# Patient Record
Sex: Female | Born: 1965 | Race: Black or African American | Hispanic: No | Marital: Married | Smoking: Never smoker
Health system: Southern US, Community
[De-identification: ages and names within clinical notes are randomized; demographics above are authoritative.]

## PROBLEM LIST (undated history)

## (undated) DIAGNOSIS — Z9109 Other allergy status, other than to drugs and biological substances: Secondary | ICD-10-CM

## (undated) DIAGNOSIS — D259 Leiomyoma of uterus, unspecified: Secondary | ICD-10-CM

## (undated) DIAGNOSIS — C50919 Malignant neoplasm of unspecified site of unspecified female breast: Secondary | ICD-10-CM

## (undated) DIAGNOSIS — Z9889 Other specified postprocedural states: Secondary | ICD-10-CM

## (undated) DIAGNOSIS — G5601 Carpal tunnel syndrome, right upper limb: Secondary | ICD-10-CM

## (undated) DIAGNOSIS — M79641 Pain in right hand: Secondary | ICD-10-CM

## (undated) DIAGNOSIS — M79642 Pain in left hand: Secondary | ICD-10-CM

## (undated) DIAGNOSIS — G5602 Carpal tunnel syndrome, left upper limb: Secondary | ICD-10-CM

## (undated) DIAGNOSIS — R112 Nausea with vomiting, unspecified: Secondary | ICD-10-CM

## (undated) DIAGNOSIS — J302 Other seasonal allergic rhinitis: Secondary | ICD-10-CM

## (undated) HISTORY — PX: BREAST REDUCTION SURGERY: SHX8

## (undated) HISTORY — DX: Pain in left hand: M79.642

## (undated) HISTORY — DX: Malignant neoplasm of unspecified site of unspecified female breast: C50.919

## (undated) HISTORY — DX: Pain in right hand: M79.641

## (undated) HISTORY — DX: Carpal tunnel syndrome, left upper limb: G56.02

## (undated) HISTORY — PX: MYOMECTOMY: SHX85

---

## 2005-08-07 ENCOUNTER — Encounter: Admission: RE | Admit: 2005-08-07 | Discharge: 2005-08-07 | Payer: Self-pay | Admitting: Obstetrics and Gynecology

## 2005-08-22 ENCOUNTER — Encounter: Admission: RE | Admit: 2005-08-22 | Discharge: 2005-08-22 | Payer: Self-pay | Admitting: Obstetrics and Gynecology

## 2006-08-28 ENCOUNTER — Encounter: Admission: RE | Admit: 2006-08-28 | Discharge: 2006-08-28 | Payer: Self-pay | Admitting: Obstetrics and Gynecology

## 2007-11-17 ENCOUNTER — Encounter: Admission: RE | Admit: 2007-11-17 | Discharge: 2007-11-17 | Payer: Self-pay | Admitting: Obstetrics and Gynecology

## 2008-12-04 ENCOUNTER — Encounter: Admission: RE | Admit: 2008-12-04 | Discharge: 2008-12-04 | Payer: Self-pay | Admitting: Family Medicine

## 2009-08-04 ENCOUNTER — Emergency Department (HOSPITAL_COMMUNITY): Admission: EM | Admit: 2009-08-04 | Discharge: 2009-08-04 | Payer: Self-pay | Admitting: Family Medicine

## 2010-01-21 ENCOUNTER — Encounter: Admission: RE | Admit: 2010-01-21 | Discharge: 2010-01-21 | Payer: Self-pay | Admitting: Obstetrics and Gynecology

## 2010-11-09 ENCOUNTER — Encounter: Payer: Self-pay | Admitting: Obstetrics and Gynecology

## 2010-12-23 ENCOUNTER — Other Ambulatory Visit: Payer: Self-pay | Admitting: Family Medicine

## 2010-12-23 DIAGNOSIS — Z1231 Encounter for screening mammogram for malignant neoplasm of breast: Secondary | ICD-10-CM

## 2011-01-23 LAB — POCT URINALYSIS DIP (DEVICE)
Bilirubin Urine: NEGATIVE
Glucose, UA: NEGATIVE mg/dL
Ketones, ur: NEGATIVE mg/dL
Nitrite: NEGATIVE
Protein, ur: NEGATIVE mg/dL
Specific Gravity, Urine: 1.015 (ref 1.005–1.030)
Urobilinogen, UA: 2 mg/dL — ABNORMAL HIGH (ref 0.0–1.0)
pH: 8.5 — ABNORMAL HIGH (ref 5.0–8.0)

## 2011-01-23 LAB — POCT PREGNANCY, URINE: Preg Test, Ur: NEGATIVE

## 2011-01-27 ENCOUNTER — Ambulatory Visit: Payer: Self-pay

## 2011-02-03 ENCOUNTER — Ambulatory Visit
Admission: RE | Admit: 2011-02-03 | Discharge: 2011-02-03 | Disposition: A | Discharge status: Home / Self Care | Payer: 59 | Source: Ambulatory Visit | Attending: Family Medicine | Admitting: Family Medicine

## 2011-02-03 ENCOUNTER — Other Ambulatory Visit: Payer: Self-pay | Admitting: Obstetrics and Gynecology

## 2011-02-03 DIAGNOSIS — Z1231 Encounter for screening mammogram for malignant neoplasm of breast: Secondary | ICD-10-CM

## 2011-03-10 ENCOUNTER — Other Ambulatory Visit: Payer: Self-pay | Admitting: Obstetrics and Gynecology

## 2011-03-10 DIAGNOSIS — D219 Benign neoplasm of connective and other soft tissue, unspecified: Secondary | ICD-10-CM

## 2011-03-13 ENCOUNTER — Ambulatory Visit
Admission: RE | Admit: 2011-03-13 | Discharge: 2011-03-13 | Disposition: A | Discharge status: Home / Self Care | Payer: 59 | Source: Ambulatory Visit | Attending: Obstetrics and Gynecology | Admitting: Obstetrics and Gynecology

## 2011-03-13 DIAGNOSIS — D219 Benign neoplasm of connective and other soft tissue, unspecified: Secondary | ICD-10-CM

## 2011-03-13 MED ORDER — GADOBENATE DIMEGLUMINE 529 MG/ML IV SOLN
15.0000 mL | Freq: Once | INTRAVENOUS | Status: AC | PRN
  Administered 2011-03-13: 15 mL via INTRAVENOUS

## 2011-03-27 ENCOUNTER — Other Ambulatory Visit (HOSPITAL_COMMUNITY): Payer: Self-pay | Admitting: Obstetrics and Gynecology

## 2011-03-27 DIAGNOSIS — N979 Female infertility, unspecified: Secondary | ICD-10-CM

## 2011-04-01 ENCOUNTER — Ambulatory Visit (HOSPITAL_COMMUNITY)
Admission: RE | Admit: 2011-04-01 | Discharge: 2011-04-01 | Disposition: A | Discharge status: Home / Self Care | Payer: 59 | Source: Ambulatory Visit | Attending: Obstetrics and Gynecology | Admitting: Obstetrics and Gynecology

## 2011-04-01 DIAGNOSIS — D259 Leiomyoma of uterus, unspecified: Secondary | ICD-10-CM | POA: Insufficient documentation

## 2011-04-01 DIAGNOSIS — N979 Female infertility, unspecified: Secondary | ICD-10-CM | POA: Insufficient documentation

## 2011-12-31 ENCOUNTER — Other Ambulatory Visit: Payer: Self-pay | Admitting: Family Medicine

## 2011-12-31 DIAGNOSIS — Z1231 Encounter for screening mammogram for malignant neoplasm of breast: Secondary | ICD-10-CM

## 2012-01-04 ENCOUNTER — Encounter (HOSPITAL_COMMUNITY): Payer: Self-pay

## 2012-01-04 ENCOUNTER — Emergency Department (INDEPENDENT_AMBULATORY_CARE_PROVIDER_SITE_OTHER)
Admission: EM | Admit: 2012-01-04 | Discharge: 2012-01-04 | Disposition: A | Discharge status: Home / Self Care | Payer: 59 | Source: Home / Self Care | Attending: Emergency Medicine | Admitting: Emergency Medicine

## 2012-01-04 DIAGNOSIS — G5603 Carpal tunnel syndrome, bilateral upper limbs: Secondary | ICD-10-CM

## 2012-01-04 DIAGNOSIS — G5600 Carpal tunnel syndrome, unspecified upper limb: Secondary | ICD-10-CM

## 2012-01-04 HISTORY — DX: Other allergy status, other than to drugs and biological substances: Z91.09

## 2012-01-04 MED ORDER — CARPAL TUNNEL WRIST STABILIZER MISC: 2.0000 [IU] | Freq: Every day | Status: DC

## 2012-01-04 MED ORDER — MELOXICAM 15 MG PO TABS: 15.0000 mg | ORAL_TABLET | Freq: Every day | ORAL | Status: DC

## 2012-01-04 NOTE — ED Notes (Signed)
Pt c/o of numbness to both hands and pain which increase at night.

## 2012-01-04 NOTE — Discharge Instructions (Signed)
Wear the splints at all times. Take your hands out periodically to move your wrists through full range of motion to prevent stiffness. You will need to follow up with Dr. Amanda Pea if you are not getting better in 7-10 days.

## 2012-01-08 NOTE — ED Provider Notes (Signed)
History     CSN: [id number]  Arrival date & time 01/04/12  1320   First MD Initiated Contact with Patient 01/04/12 1336      Chief Complaint  Patient presents with  . Numbness    numbness to both hand    (Consider location/radiation/quality/duration/timing/severity/associated sxs/prior treatment) HPI Comments: Patient is a right-handed female who reports bilateral hand numbness, tingling over the past few weeks. Patient states the pain is worse at night, and wakes her up. States she wakes up with her  and her wrists flexed. 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 No hand swelling, redness, bruising, recent or remote history of injury to her hands, wrists, forearms. No weakness of her grips bilaterally. Patient has not tried anything for this.  Patient is a 46 y.o. female presenting with hand pain. The history is provided by the patient. No language interpreter was used.  Hand Pain This is a chronic problem. The current episode started more than 1 week ago. The problem has been gradually worsening. The symptoms are relieved by nothing. She has tried nothing for the symptoms. The treatment provided no relief.    Past Medical History  Diagnosis Date  . Environmental allergies   . Fibroid     Past Surgical History  Procedure Date  . Myomectomy     History reviewed. No pertinent family history.  History  Substance Use Topics  . Smoking status: Never Smoker   . Smokeless tobacco: Not on file  . Alcohol Use: No    OB History    Grav Para Term Preterm Abortions TAB SAB Ect Mult Living                  Review of Systems  Musculoskeletal: Negative for myalgias and arthralgias.  Skin: Negative for color change, rash and wound.  Neurological: Positive for numbness. Negative for weakness.    Allergies  Review of patient's allergies indicates no known allergies.  Home Medications   Current Outpatient Rx  Name Route Sig  Dispense Refill  . CETIRIZINE HCL 5 MG PO TABS Oral Take 5 mg by mouth daily.    Marland Kitchen CARPAL TUNNEL WRIST STABILIZER MISC Does not apply 2 Units by Does not apply route daily. 2 each 0  . MELOXICAM 15 MG PO TABS Oral Take 1 tablet (15 mg total) by mouth daily. 14 tablet 0    BP 131/85  Pulse 76  Temp(Src) 98.9 F (37.2 C) (Oral)  Resp 16  SpO2 100%  LMP 01/04/2012  Physical Exam  Nursing note and vitals reviewed. Constitutional: She is oriented to person, place, and time. She appears well-developed and well-nourished. No distress.  HENT:  Head: Normocephalic and atraumatic.  Eyes: Conjunctivae and EOM are normal.  Neck: Normal range of motion.  Cardiovascular: Normal rate.   Pulmonary/Chest: Effort normal.  Abdominal: She exhibits no distension.  Musculoskeletal: Normal range of motion.       Tunnel, Phalen's test positive bilaterally. motor and sensation in median/radial/ulnar nerve distribution with CR< 2 secs and pulse intact b/l.   2-point discrimination intact at 5mm in all fingers and grip 5/5 bilaterally. Skin intact. No signs of trauma, swelling. Wrist, forearm, elbow WNL.   Neurological: She is alert and oriented to person, place, and time.  Skin: Skin is warm and dry.  Psychiatric: She has a normal mood and affect. Her behavior is normal. Judgment and thought content normal.    ED  Course  Procedures (including critical care time)  Labs Reviewed - No data to display No results found.   1. Bilateral carpal tunnel syndrome       MDM  H&P most consistent with bilateral carpal tunnel syndrome. Placing patient and bilateral splints, NSAIDs. Will have her followup with hand surgeon on call, if she is not better with conservative treatment in 10 days  Luiz Blare, MD 01/08/12 1340

## 2012-01-19 DIAGNOSIS — D259 Leiomyoma of uterus, unspecified: Secondary | ICD-10-CM | POA: Insufficient documentation

## 2012-02-09 ENCOUNTER — Ambulatory Visit: Payer: 59

## 2012-02-20 ENCOUNTER — Ambulatory Visit
Admission: RE | Admit: 2012-02-20 | Discharge: 2012-02-20 | Disposition: A | Discharge status: Home / Self Care | Payer: 59 | Source: Ambulatory Visit | Attending: Family Medicine | Admitting: Family Medicine

## 2012-02-20 DIAGNOSIS — Z1231 Encounter for screening mammogram for malignant neoplasm of breast: Secondary | ICD-10-CM

## 2012-03-16 ENCOUNTER — Other Ambulatory Visit: Payer: Self-pay | Admitting: Orthopedic Surgery

## 2012-03-18 ENCOUNTER — Encounter (HOSPITAL_BASED_OUTPATIENT_CLINIC_OR_DEPARTMENT_OTHER): Payer: Self-pay | Admitting: *Deleted

## 2012-03-18 NOTE — Progress Notes (Signed)
[facility] No labs needed

## 2012-03-19 ENCOUNTER — Ambulatory Visit (HOSPITAL_BASED_OUTPATIENT_CLINIC_OR_DEPARTMENT_OTHER): Payer: 59 | Admitting: *Deleted

## 2012-03-19 ENCOUNTER — Encounter (HOSPITAL_BASED_OUTPATIENT_CLINIC_OR_DEPARTMENT_OTHER): Payer: Self-pay | Admitting: Orthopedic Surgery

## 2012-03-19 ENCOUNTER — Encounter (HOSPITAL_BASED_OUTPATIENT_CLINIC_OR_DEPARTMENT_OTHER): Payer: Self-pay | Admitting: *Deleted

## 2012-03-19 ENCOUNTER — Encounter (HOSPITAL_BASED_OUTPATIENT_CLINIC_OR_DEPARTMENT_OTHER)
Admission: RE | Disposition: A | Discharge status: Home / Self Care | Payer: Self-pay | Source: Ambulatory Visit | Attending: Orthopedic Surgery

## 2012-03-19 ENCOUNTER — Ambulatory Visit (HOSPITAL_BASED_OUTPATIENT_CLINIC_OR_DEPARTMENT_OTHER)
Admission: RE | Admit: 2012-03-19 | Discharge: 2012-03-19 | Disposition: A | Discharge status: Home / Self Care | Payer: 59 | Source: Ambulatory Visit | Attending: Orthopedic Surgery | Admitting: Orthopedic Surgery

## 2012-03-19 DIAGNOSIS — G5600 Carpal tunnel syndrome, unspecified upper limb: Secondary | ICD-10-CM | POA: Insufficient documentation

## 2012-03-19 HISTORY — PX: CARPAL TUNNEL RELEASE: SHX101

## 2012-03-19 LAB — POCT HEMOGLOBIN-HEMACUE: Hemoglobin: 11.8 g/dL — ABNORMAL LOW (ref 12.0–15.0)

## 2012-03-19 SURGERY — CARPAL TUNNEL RELEASE
Anesthesia: General | Site: Hand | Laterality: Left | Wound class: Clean

## 2012-03-19 MED ORDER — ONDANSETRON HCL 4 MG/2ML IJ SOLN: 4.0000 mg | Freq: Once | INTRAMUSCULAR | Status: DC | PRN

## 2012-03-19 MED ORDER — CHLORHEXIDINE GLUCONATE 4 % EX LIQD: 60.0000 mL | Freq: Once | CUTANEOUS | Status: DC

## 2012-03-19 MED ORDER — ONDANSETRON HCL 4 MG/2ML IJ SOLN
INTRAMUSCULAR | Status: DC | PRN
  Administered 2012-03-19: 4 mg via INTRAVENOUS

## 2012-03-19 MED ORDER — HYDROMORPHONE HCL PF 1 MG/ML IJ SOLN: 0.2500 mg | INTRAMUSCULAR | Status: DC | PRN

## 2012-03-19 MED ORDER — MIDAZOLAM HCL 5 MG/5ML IJ SOLN
INTRAMUSCULAR | Status: DC | PRN
  Administered 2012-03-19: 2 mg via INTRAVENOUS

## 2012-03-19 MED ORDER — LACTATED RINGERS IV SOLN
INTRAVENOUS | Status: DC
  Administered 2012-03-19 (×2): via INTRAVENOUS

## 2012-03-19 MED ORDER — PROPOFOL 10 MG/ML IV EMUL
INTRAVENOUS | Status: DC | PRN
  Administered 2012-03-19: 200 mg via INTRAVENOUS

## 2012-03-19 MED ORDER — HYDROCODONE-ACETAMINOPHEN 5-500 MG PO TABS: 1.0000 | ORAL_TABLET | ORAL | Status: AC | PRN

## 2012-03-19 MED ORDER — LIDOCAINE HCL (CARDIAC) 20 MG/ML IV SOLN
INTRAVENOUS | Status: DC | PRN
  Administered 2012-03-19: 60 mg via INTRAVENOUS

## 2012-03-19 MED ORDER — BUPIVACAINE HCL (PF) 0.25 % IJ SOLN
INTRAMUSCULAR | Status: DC | PRN
  Administered 2012-03-19: 9 mL

## 2012-03-19 MED ORDER — DEXAMETHASONE SODIUM PHOSPHATE 10 MG/ML IJ SOLN
INTRAMUSCULAR | Status: DC | PRN
  Administered 2012-03-19: 10 mg via INTRAVENOUS

## 2012-03-19 MED ORDER — FENTANYL CITRATE 0.05 MG/ML IJ SOLN
INTRAMUSCULAR | Status: DC | PRN
  Administered 2012-03-19: 50 ug via INTRAVENOUS
  Administered 2012-03-19: 25 ug via INTRAVENOUS
  Administered 2012-03-19: 50 ug via INTRAVENOUS

## 2012-03-19 SURGICAL SUPPLY — 39 items
BANDAGE GAUZE ELAST BULKY 4 IN (GAUZE/BANDAGES/DRESSINGS) ×2 IMPLANT
BLADE SURG 15 STRL LF DISP TIS (BLADE) ×1 IMPLANT
BLADE SURG 15 STRL SS (BLADE) ×1
BNDG COHESIVE 3X5 TAN STRL LF (GAUZE/BANDAGES/DRESSINGS) ×2 IMPLANT
BNDG ESMARK 4X9 LF (GAUZE/BANDAGES/DRESSINGS) ×2 IMPLANT
CHLORAPREP W/TINT 26ML (MISCELLANEOUS) ×2 IMPLANT
CLOTH BEACON ORANGE TIMEOUT ST (SAFETY) ×2 IMPLANT
CORDS BIPOLAR (ELECTRODE) ×2 IMPLANT
COVER MAYO STAND STRL (DRAPES) ×2 IMPLANT
COVER TABLE BACK 60X90 (DRAPES) ×2 IMPLANT
CUFF TOURNIQUET SINGLE 18IN (TOURNIQUET CUFF) ×2 IMPLANT
DRAPE EXTREMITY T 121X128X90 (DRAPE) ×2 IMPLANT
DRAPE SURG 17X23 STRL (DRAPES) ×2 IMPLANT
DRSG KUZMA FLUFF (GAUZE/BANDAGES/DRESSINGS) ×2 IMPLANT
GAUZE XEROFORM 1X8 LF (GAUZE/BANDAGES/DRESSINGS) ×2 IMPLANT
GLOVE BIO SURGEON STRL SZ 6.5 (GLOVE) ×2 IMPLANT
GLOVE BIO SURGEON STRL SZ7.5 (GLOVE) ×2 IMPLANT
GLOVE BIOGEL PI IND STRL 7.0 (GLOVE) ×1 IMPLANT
GLOVE BIOGEL PI IND STRL 8 (GLOVE) ×1 IMPLANT
GLOVE BIOGEL PI INDICATOR 7.0 (GLOVE) ×1
GLOVE BIOGEL PI INDICATOR 8 (GLOVE) ×1
GLOVE SURG ORTHO 8.0 STRL STRW (GLOVE) ×2 IMPLANT
GOWN BRE IMP PREV XXLGXLNG (GOWN DISPOSABLE) IMPLANT
GOWN PREVENTION PLUS XLARGE (GOWN DISPOSABLE) IMPLANT
NEEDLE 27GAX1X1/2 (NEEDLE) ×2 IMPLANT
NS IRRIG 1000ML POUR BTL (IV SOLUTION) ×2 IMPLANT
PACK BASIN DAY SURGERY FS (CUSTOM PROCEDURE TRAY) ×2 IMPLANT
PAD CAST 3X4 CTTN HI CHSV (CAST SUPPLIES) ×1 IMPLANT
PADDING CAST ABS 4INX4YD NS (CAST SUPPLIES) ×1
PADDING CAST ABS COTTON 4X4 ST (CAST SUPPLIES) ×1 IMPLANT
PADDING CAST COTTON 3X4 STRL (CAST SUPPLIES) ×1
SPONGE GAUZE 4X4 12PLY (GAUZE/BANDAGES/DRESSINGS) ×2 IMPLANT
STOCKINETTE 4X48 STRL (DRAPES) ×2 IMPLANT
SUT VICRYL 4-0 PS2 18IN ABS (SUTURE) IMPLANT
SUT VICRYL RAPIDE 4/0 PS 2 (SUTURE) ×2 IMPLANT
SYR BULB 3OZ (MISCELLANEOUS) ×2 IMPLANT
SYR CONTROL 10ML LL (SYRINGE) ×2 IMPLANT
TOWEL OR 17X24 6PK STRL BLUE (TOWEL DISPOSABLE) IMPLANT
UNDERPAD 30X30 INCONTINENT (UNDERPADS AND DIAPERS) ×2 IMPLANT

## 2012-03-19 NOTE — Anesthesia Preprocedure Evaluation (Addendum)
Anesthesia Evaluation  Patient identified by MRN, date of birth, ID band Patient awake    Reviewed: Allergy & Precautions, H&P , NPO status , Patient's Chart, lab work & pertinent test results  Airway Mallampati: I      Dental  (+) Teeth Intact   Pulmonary  breath sounds clear to auscultation        Cardiovascular Rhythm:Regular Rate:Normal     Neuro/Psych    GI/Hepatic   Endo/Other    Renal/GU      Musculoskeletal   Abdominal   Peds  Hematology   Anesthesia Other Findings   Reproductive/Obstetrics                           Anesthesia Physical Anesthesia Plan  ASA: I  Anesthesia Plan: General   Post-op Pain Management:    Induction: Intravenous  Airway Management Planned: LMA  Additional Equipment:   Intra-op Plan:   Post-operative Plan:   Informed Consent: I have reviewed the patients History and Physical, chart, labs and discussed the procedure including the risks, benefits and alternatives for the proposed anesthesia with the patient or authorized representative who has indicated his/her understanding and acceptance.   Dental advisory given  Plan Discussed with:   Anesthesia Plan Comments: (L CTS   Plan GA with LMA  Kipp Brood, MD)        Anesthesia Quick Evaluation

## 2012-03-19 NOTE — Brief Op Note (Signed)
03/19/2012  1:16 PM  PATIENT:  Jasmine Batten Meadows  46 y.o. female  PRE-OPERATIVE DIAGNOSIS:  left carpal tunnel syndrome  POST-OPERATIVE DIAGNOSIS:  left carpal tunnel syndrome  PROCEDURE:  Procedure(s) (LRB): CARPAL TUNNEL RELEASE (Left)  SURGEON:  Surgeon(s) and Role:    * Nicki Reaper, MD - Primary  PHYSICIAN ASSISTANT:   ASSISTANTS: none   ANESTHESIA:   local and general  EBL:  Total I/O In: 1000 [I.V.:1000] Out: -   BLOOD ADMINISTERED:none  DRAINS: none   LOCAL MEDICATIONS USED:  MARCAINE     SPECIMEN:  No Specimen  DISPOSITION OF SPECIMEN:  N/A  COUNTS:  YES  TOURNIQUET:   Total Tourniquet Time Documented: Forearm (Left) - 15 minutes  DICTATION: .Other Dictation: Dictation Number [id number]  PLAN OF CARE: Discharge to home after PACU  PATIENT DISPOSITION:  PACU - hemodynamically stable.

## 2012-03-19 NOTE — Op Note (Signed)
NAMEMarland Kitchen  DECHELLE, ATTAWAY NO.:  [id number]  MEDICAL RECORD NO.:  [id number]  LOCATION:                                 FACILITY:  PHYSICIAN:  Cindee Salt, M.D.       DATE OF BIRTH:  12-16-1965  DATE OF PROCEDURE:  03/19/2012 DATE OF DISCHARGE:                              OPERATIVE REPORT   PREOPERATIVE DIAGNOSIS:  Carpal tunnel syndrome, left hand.  POSTOPERATIVE DIAGNOSIS:  Carpal tunnel syndrome, left hand.  OPERATION:  Release median nerve, left wrist.  SURGEON:  Cindee Salt, M.D.  ANESTHESIA:  General with local infiltration.  ANESTHESIOLOGIST:  Dr. Jean Rosenthal.  HISTORY:  The patient is a 46 year old female with a history of carpal tunnel syndrome, EMG nerve conductions positive.  This has not responded to conservative treatment.  She has elected to undergo surgical release. Pre, peri, and postoperative course have been discussed along with risks and complications.  She is aware that there is no guarantee with the surgery, possibility of infection, recurrence, injury to arteries, nerves, tendons, incomplete relief of symptoms, dystrophy.  In preoperative area, the patient is seen, the extremity marked by both the patient and surgeon.  Antibiotic given.  PROCEDURE:  The patient was brought to the operating room where a general anesthetic was carried out without difficulty.  She was prepped using ChloraPrep, supine position with the left arm free.  A 3 minute dry time was allowed.  Time-out taken, confirming the patient, procedure.  The limb was exsanguinated with an Esmarch bandage. Tourniquet placed high on the arm, was inflated to 250 mmHg.  A longitudinal incision was made in the palm, carried down through subcutaneous tissue.  Bleeders were electrocauterized with bipolar.  The right angle retractor, Sewall retractor were placed and the median nerve was released on its ulnar aspect taking care to protect both median and ulnar nerves.  The nerve was  explored.  Air compression to the nerve was apparent.  No further lesions were identified.  The wound was irrigated. The skin was closed with interrupted 4-0 Vicryl Rapide sutures. Local infiltration 0.25% Marcaine without epinephrine was given, approximately 9 mL was used.  A sterile compressive dressing was applied with the fingers free.  On deflation of the tourniquet, all fingers immediately pinked.  She was taken to the recovery room for observation in satisfactory condition.          ______________________________ Cindee Salt, M.D.     GK/MEDQ  D:  03/19/2012  T:  03/19/2012  Job:  [id number]

## 2012-03-19 NOTE — Transfer of Care (Signed)
Immediate Anesthesia Transfer of Care Note  Patient: Jasmine Meadows  Procedure(s) Performed: Procedure(s) (LRB): CARPAL TUNNEL RELEASE (Left)  Patient Location: PACU  Anesthesia Type: General  Level of Consciousness: awake, alert , oriented and patient cooperative  Airway & Oxygen Therapy: Patient Spontanous Breathing and Patient connected to face mask oxygen  Post-op Assessment: Report given to PACU RN and Post -op Vital signs reviewed and stable  Post vital signs: Reviewed and stable  Complications: No apparent anesthesia complications

## 2012-03-19 NOTE — Anesthesia Procedure Notes (Signed)
Procedure Name: LMA Insertion Date/Time: 03/19/2012 12:50 PM Performed by: Aurora Rody D Pre-anesthesia Checklist: Patient identified, Emergency Drugs available, Suction available and Patient being monitored Patient Re-evaluated:Patient Re-evaluated prior to inductionOxygen Delivery Method: Circle System Utilized Preoxygenation: Pre-oxygenation with 100% oxygen Intubation Type: IV induction Ventilation: Mask ventilation without difficulty LMA: LMA inserted LMA Size: 4.0 Number of attempts: 1 Airway Equipment and Method: bite block Placement Confirmation: positive ETCO2 Tube secured with: Tape Dental Injury: Teeth and Oropharynx as per pre-operative assessment

## 2012-03-19 NOTE — Op Note (Signed)
Dictated number: [id number]

## 2012-03-19 NOTE — H&P (Signed)
  Jasmine Meadows is a 46 year old right hand dominant female referred by Dr. Sigmund Hazel for a consultation with respect to bilateral hand numbness, tingling and pain. This has been going on for at least 4 months. She has some discomfort going up to her left shoulder on her left side only. She complains of being awakened 7 out of 7 nights. She has no history of injury to the hand or neck. She complains of constant, moderate to severe sharp pain with a feeling of swelling, numbness to the thumb especially on a constant basis to the middle and ring fingers. She states it is getting worse. Activity and work makes it worse, elevation has helped. She has taken a Medrol Dosepak which gave her some relief of symptoms. She has had nerve conductions done by Dr. Murray Hodgkins revealing severe carpal tunnel syndrome bilaterally with motor delay greater than 7 on each side and no sensory response. She states that her mother had carpal tunnel syndrome. No history of diabetes, thyroid problems, arthritis or gout. There is a family history of arthritis. She has been wearing a brace at night.   Past Medical History: She has no allergies. She is on Zyrtec. She relates no surgery.  Family Medical History: Negative.  Social History: She does not smoke or drink. She is married and works at [employer] in the [employer]  Review of Systems: Negative for 14 points.  Jasmine Meadows is an 46 y.o. female.   Chief Complaint: CTS lt HPI:  See above   Past Medical History  Diagnosis Date  . Environmental allergies   . Fibroid   . Carpal tunnel syndrome   . No pertinent past medical history     Past Surgical History  Procedure Date  . Myomectomy     No family history on file. Social History:  reports that she has never smoked. She does not have any smokeless tobacco history on file. She reports that she does not drink alcohol or use illicit drugs.  Allergies: No Known Allergies  No prescriptions prior to admission    No  results found for this or any previous visit (from the past 48 hour(s)).  No results found.   Pertinent items are noted in HPI.  Height 5\' 2"  (1.575 m), weight 83.915 kg (185 lb), last menstrual period 02/02/2012.  General appearance: alert, cooperative and appears stated age Head: Normocephalic, without obvious abnormality Neck: no adenopathy Resp: clear to auscultation bilaterally Cardio: regular rate and rhythm, S1, S2 normal, no murmur, click, rub or gallop GI: soft, non-tender; bowel sounds normal; no masses,  no organomegaly Extremities: extremities normal, atraumatic, no cyanosis or edema Pulses: 2+ and symmetric Skin: Skin color, texture, turgor normal. 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  We have discussed with her the possibility of surgical intervention. The pre, peri and post op course are discussed along with risks and complications. She is aware there is no guarantee with surgery, possibility of infection, recurrence, injury to arteries, nerves and tendons, incomplete relief of symptoms and dystrophy.  She would like to proceed to have the left side done. She is scheduled for left carpal tunnel release as an outpatient   Jasmine Meadows 03/19/2012, 7:20 AM

## 2012-03-19 NOTE — Discharge Instructions (Addendum)

## 2012-03-22 NOTE — Anesthesia Postprocedure Evaluation (Signed)
  Anesthesia Post-op Note  Patient: Jasmine Meadows  Procedure(s) Performed: Procedure(s) (LRB): CARPAL TUNNEL RELEASE (Left)  Patient Location: PACU  Anesthesia Type: General  Level of Consciousness: awake and alert   Airway and Oxygen Therapy: Patient Spontanous Breathing  Post-op Pain: none  Post-op Assessment: Post-op Vital signs reviewed and Patient's Cardiovascular Status Stable  Post-op Vital Signs: stable  Complications: No apparent anesthesia complications

## 2012-03-24 ENCOUNTER — Encounter (HOSPITAL_BASED_OUTPATIENT_CLINIC_OR_DEPARTMENT_OTHER): Payer: Self-pay | Admitting: Orthopedic Surgery

## 2012-04-19 DIAGNOSIS — G5601 Carpal tunnel syndrome, right upper limb: Secondary | ICD-10-CM

## 2012-04-19 HISTORY — DX: Carpal tunnel syndrome, right upper limb: G56.01

## 2012-04-27 ENCOUNTER — Other Ambulatory Visit: Payer: Self-pay | Admitting: Orthopedic Surgery

## 2012-04-30 ENCOUNTER — Encounter (HOSPITAL_BASED_OUTPATIENT_CLINIC_OR_DEPARTMENT_OTHER): Payer: Self-pay | Admitting: *Deleted

## 2012-05-07 ENCOUNTER — Encounter (HOSPITAL_BASED_OUTPATIENT_CLINIC_OR_DEPARTMENT_OTHER): Payer: Self-pay | Admitting: *Deleted

## 2012-05-07 ENCOUNTER — Encounter (HOSPITAL_BASED_OUTPATIENT_CLINIC_OR_DEPARTMENT_OTHER)
Admission: RE | Disposition: A | Discharge status: Home / Self Care | Payer: Self-pay | Source: Ambulatory Visit | Attending: Orthopedic Surgery

## 2012-05-07 ENCOUNTER — Ambulatory Visit (HOSPITAL_BASED_OUTPATIENT_CLINIC_OR_DEPARTMENT_OTHER): Payer: 59 | Admitting: *Deleted

## 2012-05-07 ENCOUNTER — Encounter (HOSPITAL_BASED_OUTPATIENT_CLINIC_OR_DEPARTMENT_OTHER): Payer: Self-pay | Admitting: Anesthesiology

## 2012-05-07 ENCOUNTER — Ambulatory Visit (HOSPITAL_BASED_OUTPATIENT_CLINIC_OR_DEPARTMENT_OTHER)
Admission: RE | Admit: 2012-05-07 | Discharge: 2012-05-07 | Disposition: A | Discharge status: Home / Self Care | Payer: 59 | Source: Ambulatory Visit | Attending: Orthopedic Surgery | Admitting: Orthopedic Surgery

## 2012-05-07 DIAGNOSIS — G5600 Carpal tunnel syndrome, unspecified upper limb: Secondary | ICD-10-CM | POA: Insufficient documentation

## 2012-05-07 HISTORY — DX: Leiomyoma of uterus, unspecified: D25.9

## 2012-05-07 HISTORY — DX: Other specified postprocedural states: Z98.890

## 2012-05-07 HISTORY — PX: CARPAL TUNNEL RELEASE: SHX101

## 2012-05-07 HISTORY — DX: Carpal tunnel syndrome, right upper limb: G56.01

## 2012-05-07 HISTORY — DX: Nausea with vomiting, unspecified: R11.2

## 2012-05-07 HISTORY — DX: Other seasonal allergic rhinitis: J30.2

## 2012-05-07 LAB — POCT HEMOGLOBIN-HEMACUE: Hemoglobin: 13 g/dL (ref 12.0–15.0)

## 2012-05-07 SURGERY — CARPAL TUNNEL RELEASE
Anesthesia: Regional | Laterality: Right | Wound class: Clean

## 2012-05-07 MED ORDER — OXYCODONE HCL 5 MG/5ML PO SOLN: 5.0000 mg | Freq: Once | ORAL | Status: DC | PRN

## 2012-05-07 MED ORDER — FENTANYL CITRATE 0.05 MG/ML IJ SOLN
INTRAMUSCULAR | Status: DC | PRN
  Administered 2012-05-07: 25 ug via INTRAVENOUS
  Administered 2012-05-07: 50 ug via INTRAVENOUS

## 2012-05-07 MED ORDER — MIDAZOLAM HCL 5 MG/5ML IJ SOLN
INTRAMUSCULAR | Status: DC | PRN
  Administered 2012-05-07: 2 mg via INTRAVENOUS

## 2012-05-07 MED ORDER — CEFAZOLIN SODIUM 1-5 GM-% IV SOLN: 1.0000 g | Freq: Once | INTRAVENOUS | Status: DC

## 2012-05-07 MED ORDER — LIDOCAINE HCL (CARDIAC) 20 MG/ML IV SOLN
INTRAVENOUS | Status: DC | PRN
  Administered 2012-05-07: 50 mg via INTRAVENOUS

## 2012-05-07 MED ORDER — DEXAMETHASONE SODIUM PHOSPHATE 10 MG/ML IJ SOLN
INTRAMUSCULAR | Status: DC | PRN
  Administered 2012-05-07: 10 mg via INTRAVENOUS

## 2012-05-07 MED ORDER — PROPOFOL 10 MG/ML IV EMUL
INTRAVENOUS | Status: DC | PRN
  Administered 2012-05-07: 100 ug/kg/min via INTRAVENOUS

## 2012-05-07 MED ORDER — HYDROCODONE-ACETAMINOPHEN 5-500 MG PO TABS: 1.0000 | ORAL_TABLET | ORAL | Status: AC | PRN

## 2012-05-07 MED ORDER — CHLORHEXIDINE GLUCONATE 4 % EX LIQD: 60.0000 mL | Freq: Once | CUTANEOUS | Status: DC

## 2012-05-07 MED ORDER — HYDROMORPHONE HCL PF 1 MG/ML IJ SOLN: 0.2500 mg | INTRAMUSCULAR | Status: DC | PRN

## 2012-05-07 MED ORDER — LACTATED RINGERS IV SOLN
INTRAVENOUS | Status: DC
  Administered 2012-05-07: 11:00:00 via INTRAVENOUS

## 2012-05-07 MED ORDER — LIDOCAINE HCL (PF) 0.5 % IJ SOLN
INTRAMUSCULAR | Status: DC | PRN
  Administered 2012-05-07: 35 mL via INTRATHECAL

## 2012-05-07 MED ORDER — OXYCODONE HCL 5 MG PO TABS: 5.0000 mg | ORAL_TABLET | Freq: Once | ORAL | Status: DC | PRN

## 2012-05-07 MED ORDER — METOCLOPRAMIDE HCL 5 MG/ML IJ SOLN: 10.0000 mg | Freq: Once | INTRAMUSCULAR | Status: DC | PRN

## 2012-05-07 MED ORDER — ONDANSETRON HCL 4 MG/2ML IJ SOLN
INTRAMUSCULAR | Status: DC | PRN
  Administered 2012-05-07: 4 mg via INTRAVENOUS

## 2012-05-07 MED ORDER — CEFAZOLIN SODIUM-DEXTROSE 2-3 GM-% IV SOLR
2.0000 g | Freq: Once | INTRAVENOUS | Status: AC
  Administered 2012-05-07: 2 g via INTRAVENOUS

## 2012-05-07 MED ORDER — BUPIVACAINE HCL (PF) 0.25 % IJ SOLN
INTRAMUSCULAR | Status: DC | PRN
  Administered 2012-05-07: 5.5 mL

## 2012-05-07 SURGICAL SUPPLY — 36 items
BANDAGE GAUZE ELAST BULKY 4 IN (GAUZE/BANDAGES/DRESSINGS) ×2 IMPLANT
BLADE SURG 15 STRL LF DISP TIS (BLADE) ×1 IMPLANT
BLADE SURG 15 STRL SS (BLADE) ×1
BNDG COHESIVE 3X5 TAN STRL LF (GAUZE/BANDAGES/DRESSINGS) ×2 IMPLANT
BNDG ESMARK 4X9 LF (GAUZE/BANDAGES/DRESSINGS) IMPLANT
CHLORAPREP W/TINT 26ML (MISCELLANEOUS) ×2 IMPLANT
CLOTH BEACON ORANGE TIMEOUT ST (SAFETY) ×2 IMPLANT
CORDS BIPOLAR (ELECTRODE) ×2 IMPLANT
COVER MAYO STAND STRL (DRAPES) ×2 IMPLANT
COVER TABLE BACK 60X90 (DRAPES) ×2 IMPLANT
CUFF TOURNIQUET SINGLE 18IN (TOURNIQUET CUFF) ×2 IMPLANT
DRAPE EXTREMITY T 121X128X90 (DRAPE) ×2 IMPLANT
DRAPE SURG 17X23 STRL (DRAPES) ×4 IMPLANT
DRSG KUZMA FLUFF (GAUZE/BANDAGES/DRESSINGS) ×2 IMPLANT
GAUZE XEROFORM 1X8 LF (GAUZE/BANDAGES/DRESSINGS) ×2 IMPLANT
GLOVE BIO SURGEON STRL SZ 6.5 (GLOVE) ×2 IMPLANT
GLOVE SKINSENSE NS SZ7.0 (GLOVE) ×1
GLOVE SKINSENSE STRL SZ7.0 (GLOVE) ×1 IMPLANT
GLOVE SURG ORTHO 8.0 STRL STRW (GLOVE) ×4 IMPLANT
GOWN BRE IMP PREV XXLGXLNG (GOWN DISPOSABLE) ×2 IMPLANT
GOWN PREVENTION PLUS XLARGE (GOWN DISPOSABLE) ×2 IMPLANT
NEEDLE 27GAX1X1/2 (NEEDLE) ×2 IMPLANT
NS IRRIG 1000ML POUR BTL (IV SOLUTION) ×2 IMPLANT
PACK BASIN DAY SURGERY FS (CUSTOM PROCEDURE TRAY) ×2 IMPLANT
PAD CAST 3X4 CTTN HI CHSV (CAST SUPPLIES) ×1 IMPLANT
PADDING CAST ABS 4INX4YD NS (CAST SUPPLIES) ×1
PADDING CAST ABS COTTON 4X4 ST (CAST SUPPLIES) ×1 IMPLANT
PADDING CAST COTTON 3X4 STRL (CAST SUPPLIES) ×1
SPONGE GAUZE 4X4 12PLY (GAUZE/BANDAGES/DRESSINGS) ×2 IMPLANT
STOCKINETTE 4X48 STRL (DRAPES) ×2 IMPLANT
SUT VICRYL 4-0 PS2 18IN ABS (SUTURE) IMPLANT
SUT VICRYL RAPIDE 4/0 PS 2 (SUTURE) ×2 IMPLANT
SYR BULB 3OZ (MISCELLANEOUS) ×2 IMPLANT
SYR CONTROL 10ML LL (SYRINGE) ×2 IMPLANT
TOWEL OR 17X24 6PK STRL BLUE (TOWEL DISPOSABLE) ×2 IMPLANT
UNDERPAD 30X30 INCONTINENT (UNDERPADS AND DIAPERS) ×2 IMPLANT

## 2012-05-07 NOTE — Transfer of Care (Signed)
Immediate Anesthesia Transfer of Care Note  Patient: Jasmine Meadows  Procedure(s) Performed: Procedure(s) (LRB): CARPAL TUNNEL RELEASE (Right)  Patient Location: PACU  Anesthesia Type: Bier block  Level of Consciousness: awake  Airway & Oxygen Therapy: Patient Spontanous Breathing and Patient connected to face mask oxygen  Post-op Assessment: Report given to PACU RN and Post -op Vital signs reviewed and stable  Post vital signs: Reviewed and stable  Complications: No apparent anesthesia complications

## 2012-05-07 NOTE — Anesthesia Procedure Notes (Signed)
Procedure Name: MAC Performed by: Taelar Gronewold W Pre-anesthesia Checklist: Patient identified, Timeout performed, Emergency Drugs available, Suction available and Patient being monitored Oxygen Delivery Method: Simple face mask       

## 2012-05-07 NOTE — Anesthesia Preprocedure Evaluation (Addendum)
Anesthesia Evaluation  Patient identified by MRN, date of birth, ID band Patient awake    Reviewed: Allergy & Precautions, H&P , NPO status , Patient's Chart, lab work & pertinent test results, reviewed documented beta blocker date and time   History of Anesthesia Complications (+) PONV  Airway Mallampati: II TM Distance: >3 FB Neck ROM: full    Dental   Pulmonary neg pulmonary ROS,  breath sounds clear to auscultation        Cardiovascular negative cardio ROS  Rhythm:regular     Neuro/Psych  Neuromuscular disease negative neurological ROS  negative psych ROS   GI/Hepatic negative GI ROS, Neg liver ROS,   Endo/Other  negative endocrine ROS  Renal/GU negative Renal ROS  negative genitourinary   Musculoskeletal   Abdominal   Peds  Hematology negative hematology ROS (+)   Anesthesia Other Findings See surgeon's H&P   Reproductive/Obstetrics negative OB ROS                           Anesthesia Physical Anesthesia Plan  ASA: II  Anesthesia Plan: MAC and Bier Block   Post-op Pain Management:    Induction: Intravenous  Airway Management Planned: Simple Face Mask  Additional Equipment:   Intra-op Plan:   Post-operative Plan:   Informed Consent: I have reviewed the patients History and Physical, chart, labs and discussed the procedure including the risks, benefits and alternatives for the proposed anesthesia with the patient or authorized representative who has indicated his/her understanding and acceptance.   Dental Advisory Given  Plan Discussed with: CRNA and Surgeon  Anesthesia Plan Comments:        Anesthesia Quick Evaluation

## 2012-05-07 NOTE — H&P (Signed)
Jasmine Meadows is a 46 year old right hand dominant female referred by Dr. Sigmund Hazel for a consultation with respect to bilateral hand numbness, tingling and pain. This has been going on for at least 4 months. She has some discomfort going up to her left shoulder on her left side only. She complains of being awakened 7 out of 7 nights. She has no history of injury to the hand or neck. She complains of constant, moderate to severe sharp pain with a feeling of swelling, numbness to the thumb especially on a constant basis to the middle and ring fingers. She states it is getting worse. Activity and work makes it worse, elevation has helped. She has taken a Medrol Dosepak which gave her some relief of symptoms. She has had nerve conductions done by Dr. Murray Hodgkins revealing severe carpal tunnel syndrome bilaterally with motor delay greater than 7 on each side and no sensory response. She states that her mother had carpal tunnel syndrome. No history of diabetes, thyroid problems, arthritis or gout. There is a family history of arthritis. She has been wearing a brace at night. She has had a left carpal tunnel release on 03/19/12 and is to proceed with right CTR.  Past Medical History: She has no allergies. She is on Zyrtec. She relates no surgery.  Family Medical History: Negative.  Social History: She does not smoke or drink. She is married and works at [employer] in the [employer]  Review of Systems: Negative for 14 points.  Jasmine Meadows is an 46 y.o. female.   Chief Complaint: CTS rt HPI: see [id number]  Past Medical History  Diagnosis Date  . Environmental allergies   . Seasonal allergies   . PONV (postoperative nausea and vomiting)   . Uterine fibroid     Lupron injection every 3 mos.  Marland Kitchen Open wound, hand     healing surgical site left hand, states has an open area  . Carpal tunnel syndrome of right wrist 04/2012    Past Surgical History  Procedure Date  . Myomectomy   . Carpal tunnel release  03/19/2012    Procedure: CARPAL TUNNEL RELEASE;  Surgeon: Nicki Reaper, MD;  Location: [facility];  Service: Orthopedics;  Laterality: Left;    History reviewed. No pertinent family history. Social History:  reports that she has never smoked. She has never used smokeless tobacco. She reports that she does not drink alcohol or use illicit drugs.  Allergies: No Known Allergies  Medications Prior to Admission  Medication Sig Dispense Refill  . calcium carbonate (OS-CAL) 600 MG TABS Take 600 mg by mouth 2 (two) times daily with a meal.      . cetirizine (ZYRTEC) 5 MG tablet Take 5 mg by mouth daily.      Marland Kitchen estradiol (MENOSTAR) 14 MCG/24HR Place 1 patch onto the skin once a week. 3 weeks on and 1 week off cycle. Apply the patch to a clean, dry, non-oily skin area of your lower abdomen, hips below the waist, or buttocks that has little or no hair and is free of cuts or irritation.      Marland Kitchen ibuprofen (ADVIL,MOTRIN) 200 MG tablet Take 200 mg by mouth every 6 (six) hours as needed.      Clinical research associate Bandages & Supports (CARPAL TUNNEL WRIST STABILIZER) MISC 2 Units by Does not apply route daily.  2 each  0    No results found for this or any previous visit (from the past 48 hour(s)).  No results found.   Pertinent items are noted in HPI.  Blood pressure 120/81, pulse 82, temperature 98.6 F (37 C), temperature source Oral, resp. rate 16, height 5\' 2"  (1.575 m), weight 83.915 kg (185 lb), SpO2 99.00%.  General appearance: alert, cooperative and appears stated age Head: Normocephalic, without obvious abnormality Neck: no adenopathy Resp: clear to auscultation bilaterally Cardio: regular rate and rhythm, S1, S2 normal, no murmur, click, rub or gallop GI: soft, non-tender; bowel sounds normal; no masses,  no organomegaly Extremities: extremities normal, atraumatic, no cyanosis or edema Pulses: 2+ and symmetric Skin: Skin color, texture, turgor normal. No rashes or lesions Neurologic:  Alert and oriented X 3, normal strength and tone. Normal symmetric reflexes. Normal coordination and gait Incision/Wound: na  Assessment/Plan CTS right Plan; Carpal Tunel Release  right  Jasmine Meadows R 05/07/2012, 10:51 AM

## 2012-05-07 NOTE — Anesthesia Postprocedure Evaluation (Signed)
Anesthesia Post Note  Patient: Jasmine Meadows  Procedure(s) Performed: Procedure(s) (LRB): CARPAL TUNNEL RELEASE (Right)  Anesthesia type: MAC  Patient location: PACU  Post pain: Pain level controlled  Post assessment: Patient's Cardiovascular Status Stable  Last Vitals:  Filed Vitals:   05/07/12 1423  BP: 119/69  Pulse: 82  Temp: 36.6 C  Resp: 20    Post vital signs: Reviewed and stable  Level of consciousness: alert  Complications: No apparent anesthesia complications

## 2012-05-07 NOTE — Brief Op Note (Signed)
05/07/2012  1:18 PM  PATIENT:  Jasmine Meadows  45 y.o. female  PRE-OPERATIVE DIAGNOSIS:  right carpal tunnel syndrome  POST-OPERATIVE DIAGNOSIS:  right carpal tunnel syndrome  PROCEDURE:  Procedure(s) (LRB): CARPAL TUNNEL RELEASE (Right)  SURGEON:  Surgeon(s) and Role:    * Nicki Reaper, MD - Primary  PHYSICIAN ASSISTANT:   ASSISTANTS: none   ANESTHESIA:   local and regional  EBL:  Total I/O In: 500 [I.V.:500] Out: -   BLOOD ADMINISTERED:none  DRAINS: none   LOCAL MEDICATIONS USED:  MARCAINE     SPECIMEN:  No Specimen  DISPOSITION OF SPECIMEN:  N/A  COUNTS:  YES  TOURNIQUET:   Total Tourniquet Time Documented: Forearm (Right) - 18 minutes  DICTATION: .Other Dictation: Dictation Number [id number]  PLAN OF CARE: Discharge to home after PACU  PATIENT DISPOSITION:  PACU - hemodynamically stable.

## 2012-05-07 NOTE — Op Note (Signed)
Dictated [phone]

## 2012-05-08 NOTE — Op Note (Signed)
NAMEMarland Meadows  SHERA, LAUBACH.:  [id number]  MEDICAL RECORD NO.:  [id number]  LOCATION:  [facility]                         FACILITY:  [facility]  PHYSICIAN:  Cindee Salt, M.D.       DATE OF BIRTH:  [birth year]  DATE OF PROCEDURE:  05/07/2012 DATE OF DISCHARGE:  05/07/2012                              OPERATIVE REPORT   PREOPERATIVE DIAGNOSIS:  Carpal tunnel syndrome, right hand.  POSTOPERATIVE DIAGNOSIS:  Carpal tunnel syndrome, right hand.  OPERATION:  Decompression, right median nerve.  SURGEON:  Cindee Salt, MD  ANESTHESIA:  Forearm-based IV regional with local infiltration.  HISTORY:  The patient is a 46 year old female with a history of bilateral carpal tunnel syndrome, nerve conductions positive, this has not responded to conservative treatment.  She has elected to undergo surgical release.  Pre, peri, postoperative course have been discussed along with risks and complications.  She is aware that there is no guarantee with the surgery, possibility of infection, recurrence, injury to arteries, nerves, tendons, incomplete relief of symptoms, and dystrophy.  In the preoperative area, the patient is seen, the extremity marked by both the patient and surgeon.  Antibiotic given.  DESCRIPTION OF PROCEDURE:  The patient was brought to the operating room where forearm-based IV regional anesthetic was carried out without difficulty.  She was prepped using ChloraPrep, supine position, right arm free.  A 3-minute dry time was allowed.  Time-out taken, confirming the patient and procedure.  After adequate anesthesia was afforded, a longitudinal incision was made in the palm, carried down through the subcutaneous tissue.  Bleeders were electrocauterized.  Palmar fascia was split.  Superficial palmar arch identified.  The flexor tendon to the ring and little finger identified.  The ulnar side of the median nerve carpal retinaculum was incised with sharp dissection.  A right- angle  and Sewall retractor were placed between skin and forearm fascia. The fascia released for approximately 1.5 cm proximal to the wrist crease under direct vision.  The canal was explored.  Air compression to the nerve was apparent.  No further lesions were identified.  The wound was irrigated.  Skin was then closed with interrupted 4-0 Vicryl Rapide suture.  Local infiltration with 0.25% Marcaine without epinephrine was given, approximately 5.5 mL was used.  A sterile compressive dressing was applied with the fingers free.  On deflation of the tourniquet, all fingers immediately pinked.  She was taken to the recovery room for observation in satisfactory condition.          ______________________________ Cindee Salt, M.D.     GK/MEDQ  D:  05/07/2012  T:  05/08/2012  Job:  [id number]

## 2012-05-10 ENCOUNTER — Encounter (HOSPITAL_BASED_OUTPATIENT_CLINIC_OR_DEPARTMENT_OTHER): Payer: Self-pay | Admitting: Orthopedic Surgery

## 2012-09-24 ENCOUNTER — Emergency Department (HOSPITAL_COMMUNITY)
Admission: EM | Admit: 2012-09-24 | Discharge: 2012-09-24 | Disposition: A | Discharge status: Home / Self Care | Payer: 59 | Source: Home / Self Care

## 2012-09-24 ENCOUNTER — Encounter (HOSPITAL_COMMUNITY): Payer: Self-pay

## 2012-09-24 DIAGNOSIS — J069 Acute upper respiratory infection, unspecified: Secondary | ICD-10-CM

## 2012-09-24 MED ORDER — PHENYLEPHRINE-CHLORPHEN-DM 10-4-12.5 MG/5ML PO LIQD: 5.0000 mL | ORAL | Status: DC | PRN

## 2012-09-24 NOTE — ED Notes (Signed)
C/o sick for 1 week with general body aches, cough, congestion, green secretions; minimal relief w home treatment

## 2012-09-24 NOTE — ED Provider Notes (Signed)
History     CSN: [id number]  Arrival date & time 09/24/12  4098   None     Chief Complaint  Patient presents with  . URI    (Consider location/radiation/quality/duration/timing/severity/associated sxs/prior treatment) HPI Comments: 46 year old female presents with cough, congestion, PND and hot sweats. She says her cough is worse at night when time. She denies fever, chills, shortness of breath or GI or GU symptoms.   Past Medical History  Diagnosis Date  . Environmental allergies   . Seasonal allergies   . PONV (postoperative nausea and vomiting)   . Uterine fibroid     Lupron injection every 3 mos.  Marland Kitchen Open wound, hand     healing surgical site left hand, states has an open area  . Carpal tunnel syndrome of right wrist 04/2012    Past Surgical History  Procedure Date  . Myomectomy   . Carpal tunnel release 03/19/2012    Procedure: CARPAL TUNNEL RELEASE;  Surgeon: Nicki Reaper, MD;  Location: [facility];  Service: Orthopedics;  Laterality: Left;  . Carpal tunnel release 05/07/2012    Procedure: CARPAL TUNNEL RELEASE;  Surgeon: Nicki Reaper, MD;  Location: [facility];  Service: Orthopedics;  Laterality: Right;    History reviewed. No pertinent family history.  History  Substance Use Topics  . Smoking status: Never Smoker   . Smokeless tobacco: Never Used  . Alcohol Use: No    OB History    Grav Para Term Preterm Abortions TAB SAB Ect Mult Living                  Review of Systems  Constitutional: Negative for fever, chills, activity change, appetite change and fatigue.  HENT: Positive for congestion, rhinorrhea and postnasal drip. Negative for facial swelling, neck pain and neck stiffness.   Eyes: Negative.   Respiratory: Positive for cough. Negative for choking, wheezing and stridor.   Cardiovascular: Negative.   Gastrointestinal: Negative.   Musculoskeletal: Negative.   Skin: Negative for pallor and rash.  Neurological:  Negative.     Allergies  Review of patient's allergies indicates no known allergies.  Home Medications   Current Outpatient Rx  Name  Route  Sig  Dispense  Refill  . CALCIUM CARBONATE 600 MG PO TABS   Oral   Take 600 mg by mouth 2 (two) times daily with a meal.         . CETIRIZINE HCL 5 MG PO TABS   Oral   Take 5 mg by mouth daily.         Marland Kitchen CARPAL TUNNEL WRIST STABILIZER MISC   Does not apply   2 Units by Does not apply route daily.   2 each   0   . ESTRADIOL 14 MCG/24HR TD PTWK   Transdermal   Place 1 patch onto the skin once a week. 3 weeks on and 1 week off cycle. Apply the patch to a clean, dry, non-oily skin area of your lower abdomen, hips below the waist, or buttocks that has little or no hair and is free of cuts or irritation.         . IBUPROFEN 200 MG PO TABS   Oral   Take 200 mg by mouth every 6 (six) hours as needed.         Marland Kitchen PHENYLEPHRINE-CHLORPHEN-DM 07-24-11.5 MG/5ML PO LIQD   Oral   Take 5 mLs by mouth every 4 (four) hours as needed.   120  mL   0     BP 125/79  Pulse 84  Temp 98.5 F (36.9 C) (Oral)  Resp 18  SpO2 97%  Physical Exam  Constitutional: She is oriented to person, place, and time. She appears well-developed and well-nourished. No distress.  HENT:  Head: Normocephalic and atraumatic.       Bilateral TMs are pearly gray, translucent without erythema or effusion. However, the left TM is retracted. Minimal erythema to the posterior pharynx no exudates, place clear PND  Eyes: Conjunctivae normal and EOM are normal.  Neck: Normal range of motion. Neck supple.  Cardiovascular: Normal rate and regular rhythm.   Pulmonary/Chest: Effort normal and breath sounds normal. No respiratory distress.  Abdominal: Soft. She exhibits no distension. There is no tenderness.  Musculoskeletal: Normal range of motion. She exhibits no edema.  Lymphadenopathy:    She has no cervical adenopathy.  Neurological: She is alert and oriented to person,  place, and time.  Skin: Skin is warm and dry. No rash noted.  Psychiatric: She has a normal mood and affect.    ED Course  Procedures (including critical care time)  Labs Reviewed - No data to display No results found.   1. URI (upper respiratory infection)       MDM  Norell CS 1/2-1 teaspoon every 4 hours when necessary cough congestion and drainage. Drink plenty fluids stay well hydrated Copious amount of nasal saline spray to each nostril.         Hayden Rasmussen, NP 09/24/12 1037

## 2012-09-25 NOTE — ED Provider Notes (Signed)
Medical screening examination/treatment/procedure(s) were performed by resident physician or non-physician practitioner and as supervising physician I was immediately available for consultation/collaboration.   Bailyn Spackman DOUGLAS MD.    Keelon Zurn D Ridhaan Dreibelbis, MD 09/25/12 1111 

## 2012-11-17 ENCOUNTER — Other Ambulatory Visit: Payer: Self-pay | Admitting: Family Medicine

## 2012-11-17 DIAGNOSIS — Z1231 Encounter for screening mammogram for malignant neoplasm of breast: Secondary | ICD-10-CM

## 2013-02-21 ENCOUNTER — Ambulatory Visit: Payer: 59

## 2013-04-04 ENCOUNTER — Ambulatory Visit
Admission: RE | Admit: 2013-04-04 | Discharge: 2013-04-04 | Disposition: A | Discharge status: Home / Self Care | Payer: Self-pay | Source: Ambulatory Visit | Attending: Family Medicine | Admitting: Family Medicine

## 2013-04-04 DIAGNOSIS — Z1231 Encounter for screening mammogram for malignant neoplasm of breast: Secondary | ICD-10-CM

## 2013-12-27 ENCOUNTER — Other Ambulatory Visit: Payer: Self-pay

## 2013-12-27 DIAGNOSIS — Z1231 Encounter for screening mammogram for malignant neoplasm of breast: Secondary | ICD-10-CM

## 2014-02-17 ENCOUNTER — Other Ambulatory Visit: Payer: Self-pay | Admitting: Orthopedic Surgery

## 2014-03-27 ENCOUNTER — Encounter (HOSPITAL_BASED_OUTPATIENT_CLINIC_OR_DEPARTMENT_OTHER): Payer: Self-pay | Admitting: *Deleted

## 2014-03-31 ENCOUNTER — Ambulatory Visit (HOSPITAL_BASED_OUTPATIENT_CLINIC_OR_DEPARTMENT_OTHER): Payer: 59 | Admitting: Anesthesiology

## 2014-03-31 ENCOUNTER — Encounter (HOSPITAL_BASED_OUTPATIENT_CLINIC_OR_DEPARTMENT_OTHER): Payer: Self-pay | Admitting: *Deleted

## 2014-03-31 ENCOUNTER — Encounter (HOSPITAL_BASED_OUTPATIENT_CLINIC_OR_DEPARTMENT_OTHER): Payer: 59 | Admitting: Anesthesiology

## 2014-03-31 ENCOUNTER — Encounter (HOSPITAL_BASED_OUTPATIENT_CLINIC_OR_DEPARTMENT_OTHER)
Admission: RE | Disposition: A | Discharge status: Home / Self Care | Payer: Self-pay | Source: Ambulatory Visit | Attending: Orthopedic Surgery

## 2014-03-31 ENCOUNTER — Ambulatory Visit (HOSPITAL_BASED_OUTPATIENT_CLINIC_OR_DEPARTMENT_OTHER)
Admission: RE | Admit: 2014-03-31 | Discharge: 2014-03-31 | Disposition: A | Discharge status: Home / Self Care | Payer: 59 | Source: Ambulatory Visit | Attending: Orthopedic Surgery | Admitting: Orthopedic Surgery

## 2014-03-31 DIAGNOSIS — M6740 Ganglion, unspecified site: Secondary | ICD-10-CM | POA: Insufficient documentation

## 2014-03-31 DIAGNOSIS — Z79899 Other long term (current) drug therapy: Secondary | ICD-10-CM | POA: Insufficient documentation

## 2014-03-31 DIAGNOSIS — M65839 Other synovitis and tenosynovitis, unspecified forearm: Secondary | ICD-10-CM | POA: Insufficient documentation

## 2014-03-31 DIAGNOSIS — M79609 Pain in unspecified limb: Secondary | ICD-10-CM | POA: Insufficient documentation

## 2014-03-31 DIAGNOSIS — M65849 Other synovitis and tenosynovitis, unspecified hand: Secondary | ICD-10-CM

## 2014-03-31 HISTORY — PX: TRIGGER FINGER RELEASE: SHX641

## 2014-03-31 HISTORY — PX: MASS EXCISION: SHX2000

## 2014-03-31 LAB — POCT HEMOGLOBIN-HEMACUE: Hemoglobin: 14.8 g/dL (ref 12.0–15.0)

## 2014-03-31 SURGERY — EXCISION MASS
Anesthesia: Monitor Anesthesia Care | Site: Hand | Laterality: Left

## 2014-03-31 MED ORDER — FENTANYL CITRATE 0.05 MG/ML IJ SOLN
25.0000 ug | INTRAMUSCULAR | Status: DC | PRN
  Administered 2014-03-31 (×2): 25 ug via INTRAVENOUS

## 2014-03-31 MED ORDER — OXYCODONE HCL 5 MG PO TABS
ORAL_TABLET | ORAL | Status: AC
  Filled 2014-03-31: qty 1

## 2014-03-31 MED ORDER — PROPOFOL 10 MG/ML IV BOLUS
INTRAVENOUS | Status: DC | PRN
  Administered 2014-03-31: 30 mg via INTRAVENOUS

## 2014-03-31 MED ORDER — FENTANYL CITRATE 0.05 MG/ML IJ SOLN
INTRAMUSCULAR | Status: AC
  Filled 2014-03-31: qty 4

## 2014-03-31 MED ORDER — FENTANYL CITRATE 0.05 MG/ML IJ SOLN
INTRAMUSCULAR | Status: AC
  Filled 2014-03-31: qty 2

## 2014-03-31 MED ORDER — BUPIVACAINE HCL (PF) 0.25 % IJ SOLN
INTRAMUSCULAR | Status: AC
  Filled 2014-03-31: qty 30

## 2014-03-31 MED ORDER — CHLORHEXIDINE GLUCONATE 4 % EX LIQD: 60.0000 mL | Freq: Once | CUTANEOUS | Status: DC

## 2014-03-31 MED ORDER — CEFAZOLIN SODIUM 1-5 GM-% IV SOLN
INTRAVENOUS | Status: AC
  Filled 2014-03-31: qty 100

## 2014-03-31 MED ORDER — MIDAZOLAM HCL 2 MG/2ML IJ SOLN: 1.0000 mg | INTRAMUSCULAR | Status: DC | PRN

## 2014-03-31 MED ORDER — OXYCODONE HCL 5 MG/5ML PO SOLN: 5.0000 mg | Freq: Once | ORAL | Status: AC | PRN

## 2014-03-31 MED ORDER — PROPOFOL INFUSION 10 MG/ML OPTIME
INTRAVENOUS | Status: DC | PRN
  Administered 2014-03-31: 100 ug/kg/min via INTRAVENOUS

## 2014-03-31 MED ORDER — LIDOCAINE HCL (PF) 0.5 % IJ SOLN
INTRAMUSCULAR | Status: DC | PRN
  Administered 2014-03-31: 30 mL via INTRAVENOUS

## 2014-03-31 MED ORDER — BUPIVACAINE HCL (PF) 0.25 % IJ SOLN
INTRAMUSCULAR | Status: DC | PRN
  Administered 2014-03-31: 6 mL

## 2014-03-31 MED ORDER — PROPOFOL 10 MG/ML IV BOLUS
INTRAVENOUS | Status: AC
  Filled 2014-03-31: qty 60

## 2014-03-31 MED ORDER — FENTANYL CITRATE 0.05 MG/ML IJ SOLN
INTRAMUSCULAR | Status: DC | PRN
Start: 2014-03-31 — End: 2014-03-31
  Administered 2014-03-31: 100 ug via INTRAVENOUS

## 2014-03-31 MED ORDER — CEFAZOLIN SODIUM-DEXTROSE 2-3 GM-% IV SOLR: 2.0000 g | INTRAVENOUS | Status: DC

## 2014-03-31 MED ORDER — MIDAZOLAM HCL 2 MG/2ML IJ SOLN
INTRAMUSCULAR | Status: AC
  Filled 2014-03-31: qty 2

## 2014-03-31 MED ORDER — LACTATED RINGERS IV SOLN
INTRAVENOUS | Status: DC
  Administered 2014-03-31: 13:00:00 via INTRAVENOUS

## 2014-03-31 MED ORDER — ONDANSETRON HCL 4 MG/2ML IJ SOLN
INTRAMUSCULAR | Status: DC | PRN
  Administered 2014-03-31: 4 mg via INTRAVENOUS

## 2014-03-31 MED ORDER — HYDROCODONE-ACETAMINOPHEN 5-325 MG PO TABS: 1.0000 | ORAL_TABLET | Freq: Four times a day (QID) | ORAL | Status: DC | PRN

## 2014-03-31 MED ORDER — OXYCODONE HCL 5 MG PO TABS
5.0000 mg | ORAL_TABLET | Freq: Once | ORAL | Status: AC | PRN
  Administered 2014-03-31: 5 mg via ORAL

## 2014-03-31 MED ORDER — MIDAZOLAM HCL 5 MG/5ML IJ SOLN
INTRAMUSCULAR | Status: DC | PRN
  Administered 2014-03-31: 2 mg via INTRAVENOUS

## 2014-03-31 MED ORDER — CEFAZOLIN SODIUM-DEXTROSE 2-3 GM-% IV SOLR
2.0000 g | INTRAVENOUS | Status: AC
  Administered 2014-03-31: 2 g via INTRAVENOUS

## 2014-03-31 MED ORDER — METOCLOPRAMIDE HCL 5 MG/ML IJ SOLN: 10.0000 mg | Freq: Once | INTRAMUSCULAR | Status: DC | PRN

## 2014-03-31 MED ORDER — FENTANYL CITRATE 0.05 MG/ML IJ SOLN: 50.0000 ug | INTRAMUSCULAR | Status: DC | PRN

## 2014-03-31 SURGICAL SUPPLY — 49 items
BANDAGE COBAN STERILE 2 (GAUZE/BANDAGES/DRESSINGS) ×3 IMPLANT
BLADE MINI RND TIP GREEN BEAV (BLADE) IMPLANT
BLADE SURG 15 STRL LF DISP TIS (BLADE) ×2 IMPLANT
BLADE SURG 15 STRL SS (BLADE) ×1
BNDG COHESIVE 1X5 TAN STRL LF (GAUZE/BANDAGES/DRESSINGS) IMPLANT
BNDG COHESIVE 3X5 TAN STRL LF (GAUZE/BANDAGES/DRESSINGS) IMPLANT
BNDG ESMARK 4X9 LF (GAUZE/BANDAGES/DRESSINGS) IMPLANT
BNDG GAUZE ELAST 4 BULKY (GAUZE/BANDAGES/DRESSINGS) IMPLANT
CHLORAPREP W/TINT 26ML (MISCELLANEOUS) ×9 IMPLANT
CORDS BIPOLAR (ELECTRODE) ×3 IMPLANT
COVER MAYO STAND STRL (DRAPES) ×3 IMPLANT
COVER TABLE BACK 60X90 (DRAPES) ×3 IMPLANT
CUFF TOURNIQUET SINGLE 18IN (TOURNIQUET CUFF) ×3 IMPLANT
DECANTER SPIKE VIAL GLASS SM (MISCELLANEOUS) IMPLANT
DRAIN PENROSE 1/2X12 LTX STRL (WOUND CARE) IMPLANT
DRAPE EXTREMITY T 121X128X90 (DRAPE) ×3 IMPLANT
DRAPE SURG 17X23 STRL (DRAPES) ×3 IMPLANT
GAUZE SPONGE 4X4 12PLY STRL (GAUZE/BANDAGES/DRESSINGS) ×3 IMPLANT
GAUZE XEROFORM 1X8 LF (GAUZE/BANDAGES/DRESSINGS) ×3 IMPLANT
GLOVE BIO SURGEON STRL SZ 6.5 (GLOVE) ×3 IMPLANT
GLOVE BIOGEL PI IND STRL 7.0 (GLOVE) ×2 IMPLANT
GLOVE BIOGEL PI IND STRL 8.5 (GLOVE) ×2 IMPLANT
GLOVE BIOGEL PI INDICATOR 7.0 (GLOVE) ×1
GLOVE BIOGEL PI INDICATOR 8.5 (GLOVE) ×1
GLOVE SURG ORTHO 8.0 STRL STRW (GLOVE) ×3 IMPLANT
GOWN STRL REUS W/ TWL LRG LVL3 (GOWN DISPOSABLE) ×2 IMPLANT
GOWN STRL REUS W/TWL LRG LVL3 (GOWN DISPOSABLE) ×1
GOWN STRL REUS W/TWL XL LVL3 (GOWN DISPOSABLE) ×3 IMPLANT
NDL SAFETY ECLIPSE 18X1.5 (NEEDLE) IMPLANT
NEEDLE 27GAX1X1/2 (NEEDLE) ×3 IMPLANT
NEEDLE HYPO 18GX1.5 SHARP (NEEDLE)
NS IRRIG 1000ML POUR BTL (IV SOLUTION) ×3 IMPLANT
PACK BASIN DAY SURGERY FS (CUSTOM PROCEDURE TRAY) ×3 IMPLANT
PAD CAST 3X4 CTTN HI CHSV (CAST SUPPLIES) IMPLANT
PADDING CAST ABS 3INX4YD NS (CAST SUPPLIES)
PADDING CAST ABS 4INX4YD NS (CAST SUPPLIES) ×1
PADDING CAST ABS COTTON 3X4 (CAST SUPPLIES) IMPLANT
PADDING CAST ABS COTTON 4X4 ST (CAST SUPPLIES) ×2 IMPLANT
PADDING CAST COTTON 3X4 STRL (CAST SUPPLIES)
SPLINT PLASTER CAST XFAST 3X15 (CAST SUPPLIES) IMPLANT
SPLINT PLASTER XTRA FASTSET 3X (CAST SUPPLIES)
STOCKINETTE 4X48 STRL (DRAPES) ×3 IMPLANT
SUT VIC AB 4-0 P2 18 (SUTURE) IMPLANT
SUT VICRYL RAPID 5 0 P 3 (SUTURE) IMPLANT
SUT VICRYL RAPIDE 4/0 PS 2 (SUTURE) ×3 IMPLANT
SYR BULB 3OZ (MISCELLANEOUS) ×3 IMPLANT
SYR CONTROL 10ML LL (SYRINGE) ×3 IMPLANT
TOWEL OR 17X24 6PK STRL BLUE (TOWEL DISPOSABLE) ×6 IMPLANT
UNDERPAD 30X30 INCONTINENT (UNDERPADS AND DIAPERS) ×3 IMPLANT

## 2014-03-31 NOTE — H&P (Signed)
Jasmine Meadows is a 48 year old right hand dominant female who has had triggering of her left ring finger. This has been injected on 2 occasions along with a flexor sheath cyst. This has become painful for her with the cyst slightly enlarging. It continues to catch. She desires proceeding to have this surgically released.  She is s/p carpal tunnel release done in 2013 on her right side by myself along with carpal tunnel syndrome on her left side again in 2013. These are doing well. She complains of moderate pain in her ring finger. She is complaining of the catching along with hitting the area when she picks things up causing discomfort in the ring finger and the mass.  PAST MEDICAL HISTORY: She has no known drug allergies. She is on Zyrtec. She relates no other surgery.  FAMILY H ISTORY: Negative.  SOCIAL HISTORY: She does not smoke or drink. She is married and works at [employer]: Negative for 14 points Jasmine Meadows is an 48 y.o. female.   Chief Complaint: STS left ring finger with mass HPI: see above  Past Medical History  Diagnosis Date  . Environmental allergies   . Seasonal allergies   . PONV (postoperative nausea and vomiting)   . Uterine fibroid     Lupron injection every 3 mos.  . Carpal tunnel syndrome of right wrist 04/2012    Past Surgical History  Procedure Laterality Date  . Myomectomy    . Carpal tunnel release  03/19/2012    Procedure: CARPAL TUNNEL RELEASE;  Surgeon: Wynonia Sours, MD;  Location: [facility];  Service: Orthopedics;  Laterality: Left;  . Carpal tunnel release  05/07/2012    Procedure: CARPAL TUNNEL RELEASE;  Surgeon: Wynonia Sours, MD;  Location: [facility];  Service: Orthopedics;  Laterality: Right;    History reviewed. No pertinent family history. Social History:  reports that she has never smoked. She has never used smokeless tobacco. She reports that she does not drink alcohol or use illicit  drugs.  Allergies: No Known Allergies  Medications Prior to Admission  Medication Sig Dispense Refill  . calcium carbonate (OS-CAL) 600 MG TABS Take 600 mg by mouth 2 (two) times daily with a meal.      . cetirizine (ZYRTEC) 5 MG tablet Take 5 mg by mouth daily.      Marland Kitchen estradiol (MENOSTAR) 14 MCG/24HR Place 1 patch onto the skin once a week. 3 weeks on and 1 week off cycle. Apply the patch to a clean, dry, non-oily skin area of your lower abdomen, hips below the waist, or buttocks that has little or no hair and is free of cuts or irritation.      Marland Kitchen ibuprofen (ADVIL,MOTRIN) 200 MG tablet Take 200 mg by mouth every 6 (six) hours as needed.        No results found for this or any previous visit (from the past 48 hour(s)).  No results found.   Pertinent items are noted in HPI.  Blood pressure 116/74, pulse 73, temperature 98.6 F (37 C), temperature source Oral, resp. rate 18, height 5\' 2"  (1.575 m), weight 87.091 kg (192 lb), SpO2 100.00%.  General appearance: alert, cooperative and appears stated age Head: Normocephalic, without obvious abnormality Neck: no JVD Resp: clear to auscultation bilaterally Cardio: regular rate and rhythm, S1, S2 normal, no murmur, click, rub or gallop GI: soft, non-tender; bowel sounds normal; no masses,  no organomegaly Extremities: Mass left palm Pulses:  2+ and symmetric Skin: Skin color, texture, turgor normal. 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  Plan: We have recommended surgical release of the A-1 pulley along with excision of the flexor sheath cyst which measures approximately 1 sonometer in diameter. This will be scheduled as an outpatient under regional anesthesia. The pre, peri and post op course are discussed along with risks and complications.  She is aware there is no guarantee with surgery, possibility of infection, recurrence,  injury to arteries, nerves and tendons, incomplete relief of symptoms and dystrophy.    Jasmine Meadows R 03/31/2014, 12:23 PM

## 2014-03-31 NOTE — Anesthesia Postprocedure Evaluation (Signed)
Anesthesia Post Note  Patient: Jasmine Meadows  Procedure(s) Performed: Procedure(s) (LRB): EXCISION CYST  (Left) RELEASE A-1 PULLEY LEFT RING FINGER (Left)  Anesthesia type: MAC  Patient location: PACU  Post pain: Pain level controlled  Post assessment: Patient's Cardiovascular Status Stable  Last Vitals:  Filed Vitals:   03/31/14 1540  BP: 126/67  Pulse: 66  Temp:   Resp: 8    Post vital signs: Reviewed and stable  Level of consciousness: alert  Complications: No apparent anesthesia complications

## 2014-03-31 NOTE — Brief Op Note (Signed)
03/31/2014  2:44 PM  PATIENT:  Jasmine Meadows  48 y.o. 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Other Dictation: Dictation Number [id number]  PLAN OF CARE: Discharge to home after PACU  PATIENT DISPOSITION:  PACU - hemodynamically stable.

## 2014-03-31 NOTE — Transfer of Care (Signed)
Immediate Anesthesia Transfer of Care Note  Patient: Jasmine Meadows  Procedure(s) Performed: Procedure(s) with comments: EXCISION CYST  (Left) - ANESTHESIA:  IV REGIONAL FAB RELEASE A-1 PULLEY LEFT RING FINGER (Left)  Patient Location: PACU  Anesthesia Type:MAC and Bier block  Level of Consciousness: awake, alert  and oriented  Airway & Oxygen Therapy: Patient Spontanous Breathing and Patient connected to face mask oxygen  Post-op Assessment: Report given to PACU RN and Post -op Vital signs reviewed and stable  Post vital signs: Reviewed and stable  Complications: No apparent anesthesia complications

## 2014-03-31 NOTE — Anesthesia Preprocedure Evaluation (Signed)
Anesthesia Evaluation  Patient identified by MRN, date of birth, ID band Patient awake    Reviewed: Allergy & Precautions, H&P , NPO status , Patient's Chart, lab work & pertinent test results, reviewed documented beta blocker date and time   History of Anesthesia Complications (+) PONV and history of anesthetic complications  Airway Mallampati: II TM Distance: >3 FB Neck ROM: full    Dental   Pulmonary neg pulmonary ROS,  breath sounds clear to auscultation        Cardiovascular negative cardio ROS  Rhythm:regular     Neuro/Psych  Neuromuscular disease negative neurological ROS  negative psych ROS   GI/Hepatic negative GI ROS, Neg liver ROS,   Endo/Other  negative endocrine ROS  Renal/GU negative Renal ROS  negative genitourinary   Musculoskeletal   Abdominal   Peds  Hematology negative hematology ROS (+)   Anesthesia Other Findings See surgeon's H&P   Reproductive/Obstetrics negative OB ROS                           Anesthesia Physical Anesthesia Plan  ASA: II  Anesthesia Plan: MAC and Bier Block   Post-op Pain Management:    Induction: Intravenous  Airway Management Planned: Simple Face Mask  Additional Equipment:   Intra-op Plan:   Post-operative Plan:   Informed Consent: I have reviewed the patients History and Physical, chart, labs and discussed the procedure including the risks, benefits and alternatives for the proposed anesthesia with the patient or authorized representative who has indicated his/her understanding and acceptance.   Dental Advisory Given  Plan Discussed with: CRNA and Surgeon  Anesthesia Plan Comments:         Anesthesia Quick Evaluation

## 2014-03-31 NOTE — Discharge Instructions (Addendum)

## 2014-03-31 NOTE — Op Note (Signed)
Dictation Number [id number]

## 2014-04-01 NOTE — Op Note (Signed)
NAME:  Jasmine Meadows, Jasmine Meadows NO.:  [id number]  MEDICAL RECORD NO.:  [id number]  LOCATION:                                 FACILITY:  PHYSICIAN:  Daryll Brod, M.D.            DATE OF BIRTH:  DATE OF PROCEDURE:  03/31/2014 DATE OF DISCHARGE:                              OPERATIVE REPORT   PREOPERATIVE DIAGNOSIS:  Flexor sheath cyst with stenosing tenosynovitis, left ring finger.  POSTOPERATIVE DIAGNOSIS:  Flexor sheath cyst with stenosing tenosynovitis, left ring finger.  OPERATION:  Excision of flexor sheath cyst with release of A1 pulley, left ring finger.  SURGEON:  Daryll Brod, M.D.  ANESTHESIA:  Forearm-based IV regional anesthetic with local infiltration.  ANESTHESIOLOGIST:  Jessy Oto. Albertina Parr, M.D.  HISTORY:  The patient is a 48 year old female with history of triggering of her left ring finger.  She has also a mass on the proximal aspect of the A1 pulley area.  She is desirous of release with excision of the mass.  Pre, peri, and postoperative course have been discussed along with risks and complications.  She is aware that there is no guarantee with the surgery; possibility of infection; recurrence of injury to arteries, nerves, tendons; incomplete relief of symptoms and dystrophy. In the preoperative area, the patient is seen, the extremity marked by both patient and surgeon, and antibiotic given.  PROCEDURE IN DETAIL:  The patient was brought to the operating room where a forearm-based IV regional anesthetic was carried out without difficulty.  She was prepped using ChloraPrep, supine position with the left arm free.  A 3-minute dry time was allowed.  Time-out taken, confirming the patient and procedure.  An oblique incision was made over the A1 pulley, carried down through the subcutaneous tissue.  Bleeders were electrocauterized.  A cyst was immediately encountered proximally, this was excised and sent to Pathology as a hole specimen.  The  A1 pulley was then released on its radial aspect.  A small incision was made centrally in A2.  Very significant adhesive tenosynovitis was present proximally.  This was relieved with blunt and sharp dissection. The wound was copiously irrigated with saline.  The finger placed through a full range of motion, no further triggering was noted. Neurovascular bundles were protected throughout the procedure.  The wound was irrigated.  The skin was closed with interrupted 4-0 Vicryl Rapide sutures.  At the beginning of the procedure, the patient had some feeling, a local infiltration was given 0.25% Marcaine without epinephrine, approximately 4 mL was used.  A sterile compressive dressing with the fingers free was applied.  On deflation of the tourniquet, all fingers were immediately pinked.  She was taken to the recovery room for observation in satisfactory condition.  She will be discharged to home to return to the [place] [facility] in 1 week, on [place].          ______________________________ Daryll Brod, M.D.     GK/MEDQ  D:  03/31/2014  T:  04/01/2014  Job:  [id number]

## 2014-04-03 ENCOUNTER — Encounter (HOSPITAL_BASED_OUTPATIENT_CLINIC_OR_DEPARTMENT_OTHER): Payer: Self-pay | Admitting: Orthopedic Surgery

## 2014-04-07 ENCOUNTER — Ambulatory Visit: Payer: 59

## 2014-04-17 ENCOUNTER — Ambulatory Visit
Admission: RE | Admit: 2014-04-17 | Discharge: 2014-04-17 | Disposition: A | Discharge status: Home / Self Care | Payer: 59 | Source: Ambulatory Visit

## 2014-04-17 DIAGNOSIS — Z1231 Encounter for screening mammogram for malignant neoplasm of breast: Secondary | ICD-10-CM

## 2014-11-06 ENCOUNTER — Ambulatory Visit: Payer: 59 | Admitting: Skilled Nursing Facility1

## 2014-11-27 ENCOUNTER — Encounter: Payer: Self-pay | Admitting: Skilled Nursing Facility1

## 2014-11-27 ENCOUNTER — Encounter: Payer: BLUE CROSS/BLUE SHIELD | Attending: Physician Assistant | Admitting: Skilled Nursing Facility1

## 2014-11-27 VITALS — Ht 62.0 in | Wt 188.0 lb

## 2014-11-27 DIAGNOSIS — E669 Obesity, unspecified: Secondary | ICD-10-CM | POA: Diagnosis not present

## 2014-11-27 DIAGNOSIS — Z713 Dietary counseling and surveillance: Secondary | ICD-10-CM | POA: Diagnosis not present

## 2014-11-27 DIAGNOSIS — Z6834 Body mass index (BMI) 34.0-34.9, adult: Secondary | ICD-10-CM | POA: Insufficient documentation

## 2014-11-27 NOTE — Patient Instructions (Signed)
-  Try to drink a smoothie in the morning -Try to eat three meals a day and 2-3 snacks

## 2014-11-27 NOTE — Progress Notes (Signed)
  Medical Nutrition Therapy:  Appt start time: 10:30 end time:  11:15.   Assessment:  Primary concerns today: Referred for obesity. 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 Patients Diet history consists of cutting out specific food groups like carbohydrates. Patients Weight history was155-160 pounds in her thirty's then 170 pounds in her forties. Patient states she Wants a breast reduction. Patient states she Wants to be 145-150 pounds by her birthday in August. Patient states she was 198 pounds in December but the number documented is 192 pounds.    Preferred Learning Style:   Auditory  Learning Readiness:   Change in progress   MEDICATIONS: See List   DIETARY INTAKE:  Usual eating pattern includes 3 meals and 2-3 snacks per day.  Everyday foods include none stated.  Avoided foods include none stated.    24-hr recall:  B (12 PM): [employer] or a smoothie  Snk ( AM): ice cream fudge ice cream----nuts L ( PM): salad from [employer] bread------cresant sandwhich Snk ( PM): fruit D ( PM): salem patties, yellow rice, and broccoli Snk ( PM): weight watchers fudge pop and cresant sandwhich Beverages: water, welches juice  *Eats outside of the home 4-5 times a week  Usual physical activity: 3 times a week for an hour walking or jumping jacks   Estimated energy needs: 1800 calories 200 g carbohydrates 135 g protein 50 g fat  Progress Towards Goal(s):  In progress.   Nutritional Diagnosis:  NB-1.1 Food and nutrition-related knowledge deficit As related to no prior nutrition education.  As evidenced by BMI of 34.39 and eating outside of the home often.    Intervention:  Nutrition Counseling for obesity. Dietitian educated the patient on the importance of making meals from the home, increasing her physical activity to daily movement, and proper portions. Dietitian also advised on not having a specific weight goal in mind and focusing  more on self acceptance, health parameters, and pants size.  Goals: -Try to drink a smoothie in the morning -Try to eat three meals a day and 2-3 snacks  Teaching Method Utilized:   Visual Auditory Hands on  Barriers to learning/adherence to lifestyle change: owning a bakery-temptation.  Demonstrated degree of understanding via:  Teach Back   Monitoring/Evaluation:  Dietary intake, exercise, and body weight prn.

## 2015-02-11 ENCOUNTER — Encounter (HOSPITAL_COMMUNITY): Payer: Self-pay | Admitting: *Deleted

## 2015-02-11 ENCOUNTER — Emergency Department (INDEPENDENT_AMBULATORY_CARE_PROVIDER_SITE_OTHER)
Admission: EM | Admit: 2015-02-11 | Discharge: 2015-02-11 | Disposition: A | Discharge status: Home / Self Care | Payer: BLUE CROSS/BLUE SHIELD | Source: Home / Self Care | Attending: Emergency Medicine | Admitting: Emergency Medicine

## 2015-02-11 DIAGNOSIS — S39012A Strain of muscle, fascia and tendon of lower back, initial encounter: Secondary | ICD-10-CM | POA: Diagnosis not present

## 2015-02-11 DIAGNOSIS — G43009 Migraine without aura, not intractable, without status migrainosus: Secondary | ICD-10-CM

## 2015-02-11 MED ORDER — KETOROLAC TROMETHAMINE 60 MG/2ML IM SOLN
INTRAMUSCULAR | Status: AC
Start: 2015-02-11 — End: 2015-02-11
  Filled 2015-02-11: qty 2

## 2015-02-11 MED ORDER — CYCLOBENZAPRINE HCL 5 MG PO TABS: 5.0000 mg | ORAL_TABLET | Freq: Every day | ORAL | Status: DC

## 2015-02-11 MED ORDER — ONDANSETRON HCL 4 MG PO TABS: 4.0000 mg | ORAL_TABLET | Freq: Three times a day (TID) | ORAL | Status: DC | PRN

## 2015-02-11 MED ORDER — DEXAMETHASONE SODIUM PHOSPHATE 10 MG/ML IJ SOLN
10.0000 mg | Freq: Once | INTRAMUSCULAR | Status: AC
  Administered 2015-02-11: 10 mg via INTRAMUSCULAR

## 2015-02-11 MED ORDER — DEXAMETHASONE SODIUM PHOSPHATE 10 MG/ML IJ SOLN
INTRAMUSCULAR | Status: AC
Start: 2015-02-11 — End: 2015-02-11
  Filled 2015-02-11: qty 1

## 2015-02-11 MED ORDER — BUTALBITAL-APAP-CAFFEINE 50-325-40 MG PO TABS: 1.0000 | ORAL_TABLET | Freq: Four times a day (QID) | ORAL | Status: AC | PRN

## 2015-02-11 MED ORDER — KETOROLAC TROMETHAMINE 60 MG/2ML IM SOLN
60.0000 mg | Freq: Once | INTRAMUSCULAR | Status: AC
  Administered 2015-02-11: 60 mg via INTRAMUSCULAR

## 2015-02-11 NOTE — Discharge Instructions (Signed)
You have migraine headaches. We gave you some medicine here. If you had another migraine, take Fioricet and Zofran. Do not take Fioricet more than 3 days a month.  Your back pain is likely from muscular strain. Heat and massage are excellent. You can try taking Flexeril at bedtime to see if that will help the morning stiffness.  Follow-up with your PCP as needed.

## 2015-02-11 NOTE — ED Provider Notes (Signed)
CSN: [id number]     Arrival date & time 02/11/15  0900 History   First MD Initiated Contact with Patient 02/11/15 [phone]     Chief Complaint  Patient presents with  . Headache   (Consider location/radiation/quality/duration/timing/severity/associated sxs/prior Treatment) HPI  She is a 49 year old woman here for evaluation of headache. She states she woke up this morning with a bifrontal, throbbing, aching headache. It is associated with some photophobia and nausea. No vomiting. No focal numbness, tingling, weakness. She took 2 Advil, and states it is improved, but not resolved. She states she has had headaches similar to this several times in the past. It typically will respond to Advil, and then completely resolve over 6-7 hours. She has a positive family history for migraine headaches. She reports she did have migraine headaches when she was younger.  She also reports gradual onset of high lateral low back pain over the last 2 weeks. She reports feeling very stiff in the mornings, and the pain improves as she gets up and moves around and does some stretches. She has been using heat and massage with some improvement. She's also been taking Advil with some improvement. She has been told by her OB/GYN provider that she will likely need a breast reduction surgery to prevent back pain.  Past Medical History  Diagnosis Date  . Environmental allergies   . Seasonal allergies   . PONV (postoperative nausea and vomiting)   . Uterine fibroid     Lupron injection every 3 mos.  . Carpal tunnel syndrome of right wrist 04/2012   Past Surgical History  Procedure Laterality Date  . Myomectomy    . Carpal tunnel release  03/19/2012    Procedure: CARPAL TUNNEL RELEASE;  Surgeon: Wynonia Sours, MD;  Location: [place];  Service: Orthopedics;  Laterality: Left;  . Carpal tunnel release  05/07/2012    Procedure: CARPAL TUNNEL RELEASE;  Surgeon: Wynonia Sours, MD;  Location: [place];  Service: Orthopedics;  Laterality: Right;  . Mass excision Left 03/31/2014    Procedure: EXCISION CYST ;  Surgeon: Wynonia Sours, MD;  Location: [place];  Service: Orthopedics;  Laterality: Left;  ANESTHESIA:  IV REGIONAL FAB  . Trigger finger release Left 03/31/2014    Procedure: RELEASE A-1 PULLEY LEFT RING FINGER;  Surgeon: Wynonia Sours, MD;  Location: [place];  Service: Orthopedics;  Laterality: Left;   Family History  Problem Relation Age of Onset  . Asthma Other   . Hypertension Other    History  Substance Use Topics  . Smoking status: Never Smoker   . Smokeless tobacco: Never Used  . Alcohol Use: No   OB History    No data available     Review of Systems As in history of present illness Allergies  Review of patient's allergies indicates no known allergies.  Home Medications   Prior to Admission medications   Medication Sig Start Date End Date Taking? Authorizing Provider  butalbital-acetaminophen-caffeine (FIORICET) 50-325-40 MG per tablet Take 1-2 tablets by mouth every 6 (six) hours as needed for headache. Do not take more than 3 times a month. 02/11/15 02/11/16  Melony Overly, MD  calcium carbonate (OS-CAL) 600 MG TABS Take 600 mg by mouth 2 (two) times daily with a meal.    Historical Provider, MD  cetirizine (ZYRTEC) 5 MG tablet Take 5 mg by mouth daily.    Historical Provider, MD  cyclobenzaprine (FLEXERIL) 5  MG tablet Take 1 tablet (5 mg total) by mouth at bedtime. 02/11/15   Melony Overly, MD  estradiol (MENOSTAR) 14 MCG/24HR Place 1 patch onto the skin once a week. 3 weeks on and 1 week off cycle. Apply the patch to a clean, dry, non-oily skin area of your lower abdomen, hips below the waist, or buttocks that has little or no hair and is free of cuts or irritation.    Historical Provider, MD  ibuprofen (ADVIL,MOTRIN) 200 MG tablet Take 200 mg by mouth every 6 (six) hours as needed.    Historical Provider, MD  ondansetron  (ZOFRAN) 4 MG tablet Take 1 tablet (4 mg total) by mouth every 8 (eight) hours as needed for nausea or vomiting. 02/11/15   Melony Overly, MD   BP 102/88 mmHg  Pulse 72  Temp(Src) 98 F (36.7 C) (Oral)  Resp 16  SpO2 100% Physical Exam  Constitutional: She is oriented to person, place, and time. She appears well-developed and well-nourished. No distress.  Eyes: EOM are normal. Pupils are equal, round, and reactive to light.  Neck: Neck supple.  Cardiovascular: Normal rate.   Pulmonary/Chest: Effort normal.  Lymphadenopathy:    She has no cervical adenopathy.  Neurological: She is alert and oriented to person, place, and time. No cranial nerve deficit. She exhibits normal muscle tone. Coordination normal.    ED Course  Procedures (including critical care time) Labs Review Labs Reviewed - No data to display  Imaging Review No results found.   MDM   1. Migraine without aura and without status migrainosus, not intractable   2. Low back strain, initial encounter    Toradol 60 mg IM and Decadron 10 mg IM given for headache.  Her history is most consistent with migraine headache. We'll discharge home with Fioricet and Zofran. Given that her migraine headaches are very infrequent, Fioricet is likely a good option. I discussed the importance of not taking this more than 3 times a month.  We'll try Flexeril at bedtime for her low back pain. She will be following up with her surgeon about breast reduction surgery.  Follow-up with PCP as needed.    Melony Overly, MD 02/11/15 [phone]

## 2015-02-11 NOTE — ED Notes (Signed)
Pt  Reports   Back  Pain    And headache    Not  releived  By  Jasmine Meadows       Headache    Started  Yesterday        Bak  Pain  X  sev  Weeks with  No  Apparent  Injury     -  Ambulated  To  Room  With a  Steady  Fluid  Gait   Sitting  Upright on  The  Exam table  Speaking in  Complete  sentances

## 2015-03-07 ENCOUNTER — Ambulatory Visit: Payer: BLUE CROSS/BLUE SHIELD | Attending: Family Medicine

## 2015-03-07 DIAGNOSIS — R531 Weakness: Secondary | ICD-10-CM | POA: Diagnosis not present

## 2015-03-07 DIAGNOSIS — M545 Low back pain, unspecified: Secondary | ICD-10-CM

## 2015-03-07 NOTE — Therapy (Signed)
[facility] [address], [place], [address] Phone: [phone]   Fax:  [phone]  Physical Therapy Evaluation  Patient Details  Name: Jasmine Meadows MRN: [id number] Date of Birth: March 23, 1966 Referring Provider:  Kathyrn Lass, MD  Encounter Date: 03/07/2015      PT End of Session - 03/07/15 7619    Visit Number 1   Number of Visits 9   Date for PT Re-Evaluation 04/04/15   PT Start Time 0835   PT Stop Time 1001   PT Time Calculation (min) 86 min   Activity Tolerance Patient tolerated treatment well;No increased pain   Behavior During Therapy [facility] for tasks assessed/performed      Past Medical History  Diagnosis Date  . Environmental allergies   . Seasonal allergies   . PONV (postoperative nausea and vomiting)   . Uterine fibroid     Lupron injection every 3 mos.  . Carpal tunnel syndrome of right wrist 04/2012    Past Surgical History  Procedure Laterality Date  . Myomectomy    . Carpal tunnel release  03/19/2012    Procedure: CARPAL TUNNEL RELEASE;  Surgeon: Wynonia Sours, MD;  Location: [place];  Service: Orthopedics;  Laterality: Left;  . Carpal tunnel release  05/07/2012    Procedure: CARPAL TUNNEL RELEASE;  Surgeon: Wynonia Sours, MD;  Location: [place];  Service: Orthopedics;  Laterality: Right;  . Mass excision Left 03/31/2014    Procedure: EXCISION CYST ;  Surgeon: Wynonia Sours, MD;  Location: [place];  Service: Orthopedics;  Laterality: Left;  ANESTHESIA:  IV REGIONAL FAB  . Trigger finger release Left 03/31/2014    Procedure: RELEASE A-1 PULLEY LEFT RING FINGER;  Surgeon: Wynonia Sours, MD;  Location: [place];  Service: Orthopedics;  Laterality: Left;    There were no vitals filed for this visit.  Visit Diagnosis:  Bilateral low back pain without sciatica - Plan: PT plan of care cert/re-cert  Weakness - Plan: PT  plan of care cert/re-cert      Subjective Assessment - 03/07/15 0841    Subjective Low back pain current (patient sitting) 4/10; worst 10/10 car rides; best 2/10. No incontinence. No tingling or numbness. Patient also states that she will bring her medication list next visit.    Pertinent History Patient states that she is planning on getting a breast reduction surgery secondary to back pain. Back pain began since 6 years ago. Past 2-2.5 years back pain bothered her more which affects her sleeping, and walking. Tries to walk 2-2.5 miles 2x/week to try to keep her weight down. Takes advil for her back. Was recently prescribed 2 muscle relaxers to help her back pain. Difficulty getting out of her bed. Aggravating factors include prolonged standing (1 hour), getting out of bed (has to roll out of bed), walking for about 1 hour (patient feels pressure in her lower back afterwards), sitting for prolonged periods (2 hours).  Relieving factors include wearing a band around her waist, topical spray which numbs the area, and Advil   Patient Stated Goals Patient expresses desire to get better   Currently in Pain? Yes   Pain Score 4    Pain Location Back   Pain Type Chronic pain   Pain Onset More than a month ago            [facility] PT Assessment - 03/07/15 5093  Assessment   Medical Diagnosis low back pain   Onset Date 03/01/15   Precautions   Precaution Comments no known precautions   Restrictions   Other Position/Activity Restrictions no known restrictions   Balance Screen   Has the patient fallen in the past 6 months No   Has the patient had a decrease in activity level because of a fear of falling?  No   Is the patient reluctant to leave their home because of a fear of falling?  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 PLOF: better able to perform standing tasks, walk, tolerate prolonged sitting, and better able to get out of bed.    Observation/Other Assessments    Observations (+) long sit test for posterior nutation L innominate.    Oswestry Disability Index  24% (moderate disability)   Posture/Postural Control   Posture Comments Standing posture: L lateral shift, movement crease at L3/L4 L pelvic rotation, increased lordosis   AROM   Overall AROM Comments Lumbar flexion WFL, Lumbar extension WFL but decreased thoracic extension; R lumbar side bend WFL but with R LBP, L lumbar side bend WFL with more R LBP; R trunk rotation WFL, L trunk rotation reproduced LBP R > L low back area.    Strength   Overall Strength Comments hip flexion: 4/5 bilaterally; knee extension 5/5 bilaterally; knee flexion R 5/5, L 4/5; ankle DF 4/5; hip abduction 4/5 bilaterally; glute max strength 3+/5 R, 4-/5 L   Palpation   Palpation TTP R and L scaral ala (R sacral ala more prominant)   Ambulation/Gait   Gait Comments lordosis with low back rotation instead of trunk rotation.             [place] Adult PT Treatment/Exercise - 03/07/15 0856    Knee/Hip Exercises: Seated   Other Seated Knee Exercises Directed patient with sitting with lumbar towel roll behind her back   Knee/Hip Exercises: Supine   Other Supine Knee Exercises Directed patient with supine R hip extension isometrics at end range hip flexion 6x5 second holds.    Manual Therapy   Manual therapy comments Prone P to A to T11 to T8 grade 3, R and L  L4 transverse process grade 3           PT Education - 03/07/15 1020    Education provided Yes   Education Details Ther-ex, HEP   Person(s) Educated Patient   Methods Explanation;Demonstration;Tactile cues;Verbal cues   Comprehension Verbalized understanding;Returned demonstration;Verbal cues required;Tactile cues required             PT Long Term Goals - 03/07/15 6440    PT LONG TERM GOAL #1   Title Patient will be independent with her HEP to decrease pain and increase function.   Time 4   Period Weeks   Status New   PT LONG TERM GOAL #2   Title  Patient will have a decrease in back pain to 5/10 or less at worst to improve ability to tolerate car rides and sitting.    Time 4   Period Weeks   Status New   PT LONG TERM GOAL #3   Title Patient will improve her Modified Oswestry Low Back Pain Disability questionnaire by at least 6 points as a demonstration of improved function.    Time 4   Period Weeks   Status New   PT LONG TERM GOAL #4   Title Patient will improve LE strength by 1/2 MMT  grade to help decrease back pain and improve ability to perform functional tasks.    Time 4   Period Weeks   Status New           Plan - 03/07/15 1017    Clinical Impression Statement Patient is a 48 year old female who came to physical therapy secondary to low back pain. She also presents with altered posture, positive special test suggesting lumbosacral involvement, stiffness, movement preference to L3/L4, tendernes to palpation to low back, LE weakness, and difficulty performing functional tasks such as walking, getting out of bed, and prolonged standing (such as baking). Patient will benefit from skilled physical therapy services to address the aforementioned deficits.   Decreased back pain to 1/10 after sitting and using a lumbar towel support.    Pt will benefit from skilled therapeutic intervention in order to improve on the following deficits Difficulty walking;Postural dysfunction;Pain;Decreased strength;Impaired flexibility   Rehab Potential Good   Clinical Impairments Affecting Rehab Potential chronicity of condition   PT Frequency 2x / week   PT Duration 4 weeks   PT Treatment/Interventions Aquatic Therapy;Ultrasound;Traction;Gait training;Neuromuscular re-education;Patient/family education;Passive range of motion;Cryotherapy;Therapeutic activities;Electrical Stimulation;Therapeutic exercise;Manual techniques;Moist Heat   PT Next Visit Plan correct lumbopelvic positioning, immprove lumbar and thoracic mobility, improve hip strength    Consulted and Agree with Plan of Care Patient         Problem List There are no active problems to display for this patient.   Laygo,Miguel 03/07/2015, 7:39 PM  [facility] [address] [place], [address] Phone: [phone]   Fax:  [phone]

## 2015-03-07 NOTE — Patient Instructions (Addendum)
Gave supine R hip extension isometrics (at end range hip flexion) 5x5 seconds for 3 sets as part of her HEP. Patient was also recommended to use a lumbar towel support for her low back when sitting. Patient demonstrated and verbalized understanding.   Improved exercise technique, movement at target joints, use of target muscles after mod to max verbal, visual, tactile cues.

## 2015-03-12 ENCOUNTER — Ambulatory Visit: Payer: BLUE CROSS/BLUE SHIELD

## 2015-03-12 DIAGNOSIS — M545 Low back pain, unspecified: Secondary | ICD-10-CM

## 2015-03-12 DIAGNOSIS — R531 Weakness: Secondary | ICD-10-CM

## 2015-03-12 NOTE — Patient Instructions (Addendum)
Gave supine L hip extension isometrics as part of her HEP. Patient demonstrated and verbalized understanding.    Improved exercise technique, movement at target joints, use of target muscles after mod verbal, visual, tactile cues

## 2015-03-13 NOTE — Therapy (Signed)
[facility] [address] [place], [address] Phone: [phone]   Fax:  [phone]  Physical Therapy Treatment  Patient Details  Name: Jasmine Meadows MRN: [id number] Date of Birth: 12/18/1965 Referring Provider:  Kathyrn Lass, MD  Encounter Date: 03/12/2015      PT End of Session - 03/12/15 1450    Visit Number 2   Number of Visits 9   Date for PT Re-Evaluation 04/04/15   PT Start Time 2025   PT Stop Time 1534   PT Time Calculation (min) 45 min   Activity Tolerance Patient tolerated treatment well;No increased pain   Behavior During Therapy [facility] for tasks assessed/performed      Past Medical History  Diagnosis Date  . Environmental allergies   . Seasonal allergies   . PONV (postoperative nausea and vomiting)   . Uterine fibroid     Lupron injection every 3 mos.  . Carpal tunnel syndrome of right wrist 04/2012    Past Surgical History  Procedure Laterality Date  . Myomectomy    . Carpal tunnel release  03/19/2012    Procedure: CARPAL TUNNEL RELEASE;  Surgeon: Wynonia Sours, MD;  Location: [place];  Service: Orthopedics;  Laterality: Left;  . Carpal tunnel release  05/07/2012    Procedure: CARPAL TUNNEL RELEASE;  Surgeon: Wynonia Sours, MD;  Location: [place];  Service: Orthopedics;  Laterality: Right;  . Mass excision Left 03/31/2014    Procedure: EXCISION CYST ;  Surgeon: Wynonia Sours, MD;  Location: [place];  Service: Orthopedics;  Laterality: Left;  ANESTHESIA:  IV REGIONAL FAB  . Trigger finger release Left 03/31/2014    Procedure: RELEASE A-1 PULLEY LEFT RING FINGER;  Surgeon: Wynonia Sours, MD;  Location: [place];  Service: Orthopedics;  Laterality: Left;    There were no vitals filed for this visit.  Visit Diagnosis:  Bilateral low back pain without sciatica  Weakness      Subjective Assessment - 03/12/15 1451    Subjective 8/10 back pain current.    Patient Stated Goals Patient expresses desire to get better   Currently in Pain? Yes   Pain Score 8    Pain Location Back   Pain Orientation Lower   Pain Type Chronic pain   Pain Onset More than a month ago   Multiple Pain Sites No            [facility] Adult PT Treatment/Exercise - 03/12/15 1502    Knee/Hip Exercises: Standing   Other Standing Knee Exercises Static mini forward lunge with R LE 10x3, SLS with finger touch assist 10x5 seconds each LE for 3 sets   Knee/Hip Exercises: Seated   Other Seated Knee Exercises sitting on physioball: anterior/posterior pelvic tilts 10x3, lateral pelvic tilts 10x3,    Knee/Hip Exercises: Supine   Other Supine Knee Exercises Directed patient with supine L hip flexion 10x, then with towel roll behind L pelvis 10x2; supine L hip extension isometrics 10x3 with 5 second holds, supine bridge 10x3 with 5 second holds.    Knee/Hip Exercises: Prone   Other Prone Exercises Prone on elbows with hip in ER and 10x2 breaths,           PT Education - 03/13/15 1333    Education provided Yes   Education Details Ther-ex, HEP   Person(s) Educated Patient   Methods Explanation;Demonstration;Tactile cues;Verbal cues   Comprehension Verbalized  understanding;Returned demonstration          PT Long Term Goals - 03/07/15 1224    PT LONG TERM GOAL #1   Title Patient will be independent with her HEP to decrease pain and increase function.   Time 4   Period Weeks   Status New   PT LONG TERM GOAL #2   Title Patient will have a decrease in back pain to 5/10 or less at worst to improve ability to tolerate car rides and sitting.    Time 4   Period Weeks   Status New   PT LONG TERM GOAL #3   Title Patient will improve her Modified Oswestry Low Back Pain Disability questionnaire by at least 6 points as a demonstration of improved function.    Time 4   Period Weeks   Status New   PT LONG TERM GOAL #4   Title Patient will  improve LE strength by 1/2 MMT grade to help decrease back pain and improve ability to perform functional tasks.    Time 4   Period Weeks   Status New               Plan - 03/12/15 1504    Clinical Impression Statement Decreased low back pain to 6/10 after supine L hip extension isometrics. Decreased back pain to 4-5/10 at end of session. Tolerated session well without aggravation of symptoms. Continue skilled PT services to promote proper pelvic positioning, strength, and improve function.    Pt will benefit from skilled therapeutic intervention in order to improve on the following deficits Difficulty walking;Postural dysfunction;Pain;Decreased strength;Impaired flexibility   Rehab Potential Good   Clinical Impairments Affecting Rehab Potential chronicity of condition   PT Frequency 2x / week   PT Duration 4 weeks   PT Treatment/Interventions Aquatic Therapy;Ultrasound;Traction;Gait training;Neuromuscular re-education;Patient/family education;Passive range of motion;Cryotherapy;Therapeutic activities;Electrical Stimulation;Therapeutic exercise;Manual techniques;Moist Heat   PT Next Visit Plan correct lumbopelvic positioning, immprove lumbar and thoracic mobility, improve hip strength   Consulted and Agree with Plan of Care Patient        Problem List There are no active problems to display for this patient.   Laygo,Miguel 03/13/2015, 1:39 PM  [facility] [address] [place], [address] Phone: [phone]   Fax:  [phone]

## 2015-03-15 ENCOUNTER — Ambulatory Visit: Payer: BLUE CROSS/BLUE SHIELD

## 2015-03-15 DIAGNOSIS — M545 Low back pain, unspecified: Secondary | ICD-10-CM

## 2015-03-15 DIAGNOSIS — R531 Weakness: Secondary | ICD-10-CM

## 2015-03-15 NOTE — Patient Instructions (Addendum)
Hip Flexor Stretch - Standing   Right leg behind, foot facing forward, left knee bent to 90. Place hands on wall. Bend right knee directly under hip. Squeeze buttock muscles and push hips forward. Hold position for __30_ seconds. Repeat _3__ times. Repeat with other leg. Do __3_ times per day. Copyright  V[employer]. All rights reserved.      Also added R S/L L trunk rotation 15 seconds x 3 as part of her HEP. Patient demonstrated and verbalized understanding.   Improved exercise technique, movement at target joints, use of target muscles after mod verbal, visual, tactile cues.

## 2015-03-16 ENCOUNTER — Other Ambulatory Visit: Payer: Self-pay

## 2015-03-16 DIAGNOSIS — Z1231 Encounter for screening mammogram for malignant neoplasm of breast: Secondary | ICD-10-CM

## 2015-03-16 NOTE — Therapy (Signed)
[facility] [address], [place], [address] Phone: [phone]   Fax:  [phone]  Physical Therapy Treatment  Patient Details  Name: Jasmine Meadows MRN: [id number] Date of Birth: 1966/07/01 Referring Provider:  Kathyrn Lass, MD  Encounter Date: 03/15/2015      PT End of Session - 03/15/15 1451    Visit Number 3   Number of Visits 9   Date for PT Re-Evaluation 04/04/15   PT Start Time 3295   PT Stop Time 1884   PT Time Calculation (min) 57 min   Activity Tolerance Patient tolerated treatment well;No increased pain   Behavior During Therapy [facility] for tasks assessed/performed      Past Medical History  Diagnosis Date  . Environmental allergies   . Seasonal allergies   . PONV (postoperative nausea and vomiting)   . Uterine fibroid     Lupron injection every 3 mos.  . Carpal tunnel syndrome of right wrist 04/2012    Past Surgical History  Procedure Laterality Date  . Myomectomy    . Carpal tunnel release  03/19/2012    Procedure: CARPAL TUNNEL RELEASE;  Surgeon: Wynonia Sours, MD;  Location: [place];  Service: Orthopedics;  Laterality: Left;  . Carpal tunnel release  05/07/2012    Procedure: CARPAL TUNNEL RELEASE;  Surgeon: Wynonia Sours, MD;  Location: [place];  Service: Orthopedics;  Laterality: Right;  . Mass excision Left 03/31/2014    Procedure: EXCISION CYST ;  Surgeon: Wynonia Sours, MD;  Location: [place];  Service: Orthopedics;  Laterality: Left;  ANESTHESIA:  IV REGIONAL FAB  . Trigger finger release Left 03/31/2014    Procedure: RELEASE A-1 PULLEY LEFT RING FINGER;  Surgeon: Wynonia Sours, MD;  Location: [place];  Service: Orthopedics;  Laterality: Left;    There were no vitals filed for this visit.  Visit Diagnosis:  Bilateral low back pain without sciatica  Weakness      Subjective Assessment - 03/15/15 1451     Subjective 4/10 back pain currently   Patient Stated Goals Patient expresses desire to get better   Currently in Pain? Yes   Pain Score 4    Pain Location Back   Pain Orientation Lower   Pain Type Chronic pain   Pain Onset More than a month ago   Multiple Pain Sites No            [facility] Adult PT Treatment/Exercise - 03/15/15 1453    Knee/Hip Exercises: Standing   Other Standing Knee Exercises Directed patient with standing hip flexor stretch 3x30 seconds for each LE   Knee/Hip Exercises: Seated   Other Seated Knee Exercises sitting on physioball: anterior/posterior pelvic tilts 10x3, lateral pelvic tilts 10x3, sitting on physioball trunk rotation 10x each side, sitting on physioball bilateral scap retraction (latex free red band) 10x5 seconds for 3 sets.    Knee/Hip Exercises: Supine   Other Supine Knee Exercises Directed patient with supine L hip flexion with towel roll behind L pelvis 10x3; supine R hip extension isometrics with knee in hooklying position 10x3 with 5 seconds,  supine single leg bridge using R LE with L leg straight 10x2, supine regular bridge 10x3 with 5 second holds.    Knee/Hip Exercises: Sidelying   Other Sidelying Knee Exercises Directed patient with R S/L L trunk rotation 15 seconds x 3  decreased back pain to 2/10  Manual Therapy   Manual therapy comments Prone P to A to R L5 transverse process grade 3. Increased time secondary to stiffness.  decreased back pain to 3/10            PT Education - 03/16/15 1120    Education provided Yes   Education Details ther-ex, HEP   Person(s) Educated Patient   Methods Explanation;Demonstration;Tactile cues;Verbal cues;Handout   Comprehension Verbalized understanding;Returned demonstration             PT Long Term Goals - 03/07/15 4970    PT LONG TERM GOAL #1   Title Patient will be independent with her HEP to decrease pain and increase function.   Time 4   Period Weeks   Status New   PT LONG TERM  GOAL #2   Title Patient will have a decrease in back pain to 5/10 or less at worst to improve ability to tolerate car rides and sitting.    Time 4   Period Weeks   Status New   PT LONG TERM GOAL #3   Title Patient will improve her Modified Oswestry Low Back Pain Disability questionnaire by at least 6 points as a demonstration of improved function.    Time 4   Period Weeks   Status New   PT LONG TERM GOAL #4   Title Patient will improve LE strength by 1/2 MMT grade to help decrease back pain and improve ability to perform functional tasks.    Time 4   Period Weeks   Status New               Plan - 03/16/15 1121    Clinical Impression Statement Decreased low back pain after P to A to R L5 transverse process as well as R S/L L trunk rotation. Demonstrates R lower lumbar stiffness as well as possible disc involvement. Continue skilled physical therapy services to improve spine mobility and hip extension.    Pt will benefit from skilled therapeutic intervention in order to improve on the following deficits Difficulty walking;Postural dysfunction;Pain;Decreased strength;Impaired flexibility   Rehab Potential Good   Clinical Impairments Affecting Rehab Potential chronicity of condition   PT Frequency 2x / week   PT Duration 4 weeks   PT Treatment/Interventions Aquatic Therapy;Ultrasound;Traction;Gait training;Neuromuscular re-education;Patient/family education;Passive range of motion;Cryotherapy;Therapeutic activities;Electrical Stimulation;Therapeutic exercise;Manual techniques;Moist Heat   PT Next Visit Plan correct lumbopelvic positioning, immprove lumbar and thoracic mobility, improve hip strength and extension.   Consulted and Agree with Plan of Care Patient        Problem List There are no active problems to display for this patient.   Hiedi Touchton 03/16/2015, 11:31 AM  [place] [facility] [address] [place], [address] Phone: [phone]   Fax:  [phone]

## 2015-03-20 ENCOUNTER — Ambulatory Visit: Payer: BLUE CROSS/BLUE SHIELD

## 2015-03-22 ENCOUNTER — Ambulatory Visit: Payer: BLUE CROSS/BLUE SHIELD | Attending: Family Medicine

## 2015-03-22 DIAGNOSIS — M545 Low back pain, unspecified: Secondary | ICD-10-CM

## 2015-03-22 DIAGNOSIS — R531 Weakness: Secondary | ICD-10-CM | POA: Diagnosis present

## 2015-03-22 NOTE — Patient Instructions (Addendum)
Hip Flexor Stretch - Standing   Right leg behind, foot facing forward, left knee bent to 90. Place hands on wall. Bend right knee directly under hip. Squeeze buttock muscles and push hips forward. Hold position for _30 seconds. Repeat _3__ times. Repeat with other leg. Do _2__ times per day.  Copyright  VHI. All rights reserved.    Improved exercise technique, movement at target joints, use of target muscles after mod verbal, visual, tactile cues.

## 2015-03-22 NOTE — Therapy (Signed)
[facility] [address], [place], [address] Phone: [phone]   Fax:  [phone]  Physical Therapy Treatment  Patient Details  Name: Jasmine Meadows MRN: [id number] Date of Birth: August 13, 1966 Referring Provider:  Kathyrn Lass, MD  Encounter Date: 03/22/2015      PT End of Session - 03/22/15 1011    Visit Number 4   Number of Visits 9   Date for PT Re-Evaluation 04/04/15   PT Start Time 1011   PT Stop Time 1100   PT Time Calculation (min) 49 min   Activity Tolerance Patient tolerated treatment well;No increased pain   Behavior During Therapy [facility] for tasks assessed/performed      Past Medical History  Diagnosis Date  . Environmental allergies   . Seasonal allergies   . PONV (postoperative nausea and vomiting)   . Uterine fibroid     Lupron injection every 3 mos.  . Carpal tunnel syndrome of right wrist 04/2012    Past Surgical History  Procedure Laterality Date  . Myomectomy    . Carpal tunnel release  03/19/2012    Procedure: CARPAL TUNNEL RELEASE;  Surgeon: Wynonia Sours, MD;  Location: [place];  Service: Orthopedics;  Laterality: Left;  . Carpal tunnel release  05/07/2012    Procedure: CARPAL TUNNEL RELEASE;  Surgeon: Wynonia Sours, MD;  Location: [place];  Service: Orthopedics;  Laterality: Right;  . Mass excision Left 03/31/2014    Procedure: EXCISION CYST ;  Surgeon: Wynonia Sours, MD;  Location: [place];  Service: Orthopedics;  Laterality: Left;  ANESTHESIA:  IV REGIONAL FAB  . Trigger finger release Left 03/31/2014    Procedure: RELEASE A-1 PULLEY LEFT RING FINGER;  Surgeon: Wynonia Sours, MD;  Location: [place];  Service: Orthopedics;  Laterality: Left;    There were no vitals filed for this visit.  Visit Diagnosis:  Bilateral low back pain without sciatica  Weakness      Subjective Assessment - 03/22/15 1012    Subjective Patient states that when she woke up the other day, her back bothered her a lot. Back was good after she left last session. The exercises at home helps. Back pain currently 5-6/10. Has not taken pain medications yet. Patient also also trying to exercise on the treadmill (uphill) and wears a lumbar strap. Back starts bothering her after 15 min of using the treadmil    Patient Stated Goals Patient expresses desire to get better   Pain Score 6    Pain Location Back   Pain Orientation Lower   Pain Type Chronic pain   Pain Onset More than a month ago   Multiple Pain Sites No          [facility] Adult PT Treatment/Exercise - 03/22/15 1020    Knee/Hip Exercises: Standing   Other Standing Knee Exercises Directed paitient with standing lateral shift correction 10x5 seconds for L side and 10x5 seconds for 2 sets for R side; prone on elbows with 10x2 deep breaths, standing hip flexor stretch 30 seconds x 3 each LE.    Knee/Hip Exercises: Sidelying   Other Sidelying Knee Exercises Directed patient with R S/L L trunk rotation 30 seconds x 3  decreased back pain to 2/10   Manual Therapy   Manual therapy comments Prone P to A to R L5 and L4 transverse process grade 3; prone P to A to lower and  mid thoracic spine grade 3.   Back pain decresaed to 4/10           PT Education - 03/22/15 1940    Education provided Yes   Education Details ther-ex, HEP   Person(s) Educated Patient   Methods Explanation;Demonstration;Tactile cues;Verbal cues;Handout   Comprehension Verbalized understanding;Returned demonstration          PT Long Term Goals - 03/07/15 4081    PT LONG TERM GOAL #1   Title Patient will be independent with her HEP to decrease pain and increase function.   Time 4   Period Weeks   Status New   PT LONG TERM GOAL #2   Title Patient will have a decrease in back pain to 5/10 or less at worst to improve ability to tolerate car rides and sitting.    Time 4   Period Weeks   Status  New   PT LONG TERM GOAL #3   Title Patient will improve her Modified Oswestry Low Back Pain Disability questionnaire by at least 6 points as a demonstration of improved function.    Time 4   Period Weeks   Status New   PT LONG TERM GOAL #4   Title Patient will improve LE strength by 1/2 MMT grade to help decrease back pain and improve ability to perform functional tasks.    Time 4   Period Weeks   Status New            Plan - 03/22/15 1110    Clinical Impression Statement Patient tolerated session without aggravation of symptoms. Decreased back pain to 4/10 after manual therapy. Decreased to 3/10 after R S/L L trunk rotation exercise   Pt will benefit from skilled therapeutic intervention in order to improve on the following deficits Difficulty walking;Postural dysfunction;Pain;Decreased strength;Impaired flexibility   Rehab Potential Good   Clinical Impairments Affecting Rehab Potential chronicity of condition   PT Frequency 2x / week   PT Duration 4 weeks   PT Treatment/Interventions Aquatic Therapy;Ultrasound;Traction;Gait training;Neuromuscular re-education;Patient/family education;Passive range of motion;Cryotherapy;Therapeutic activities;Electrical Stimulation;Therapeutic exercise;Manual techniques;Moist Heat   PT Next Visit Plan correct lumbopelvic positioning, immprove lumbar and thoracic mobility, improve hip strength and extension.   Consulted and Agree with Plan of Care Patient        Problem List There are no active problems to display for this patient.   Ivis Nicolson 03/22/2015, 7:45 PM  [facility] [address] [place], [address] Phone: [phone]   Fax:  [phone]

## 2015-03-27 ENCOUNTER — Ambulatory Visit: Payer: BLUE CROSS/BLUE SHIELD

## 2015-03-27 DIAGNOSIS — M545 Low back pain, unspecified: Secondary | ICD-10-CM

## 2015-03-27 DIAGNOSIS — R531 Weakness: Secondary | ICD-10-CM

## 2015-03-27 NOTE — Therapy (Signed)
[facility] [address] [place], [address] Phone: [phone]   Fax:  [phone]  Physical Therapy Treatment  Patient Details  Name: Jasmine Meadows MRN: [id number] Date of Birth: 07-30-1966 Referring Provider:  Kathyrn Lass, MD  Encounter Date: 03/27/2015      PT End of Session - 03/27/15 0925    Visit Number 5   Number of Visits 9   Date for PT Re-Evaluation 04/04/15   PT Start Time 0925   PT Stop Time 0086   PT Time Calculation (min) 70 min   Activity Tolerance Patient tolerated treatment well;No increased pain   Behavior During Therapy [facility] for tasks assessed/performed      Past Medical History  Diagnosis Date  . Environmental allergies   . Seasonal allergies   . PONV (postoperative nausea and vomiting)   . Uterine fibroid     Lupron injection every 3 mos.  . Carpal tunnel syndrome of right wrist 04/2012    Past Surgical History  Procedure Laterality Date  . Myomectomy    . Carpal tunnel release  03/19/2012    Procedure: CARPAL TUNNEL RELEASE;  Surgeon: Wynonia Sours, MD;  Location: [place];  Service: Orthopedics;  Laterality: Left;  . Carpal tunnel release  05/07/2012    Procedure: CARPAL TUNNEL RELEASE;  Surgeon: Wynonia Sours, MD;  Location: [place];  Service: Orthopedics;  Laterality: Right;  . Mass excision Left 03/31/2014    Procedure: EXCISION CYST ;  Surgeon: Wynonia Sours, MD;  Location: [place];  Service: Orthopedics;  Laterality: Left;  ANESTHESIA:  IV REGIONAL FAB  . Trigger finger release Left 03/31/2014    Procedure: RELEASE A-1 PULLEY LEFT RING FINGER;  Surgeon: Wynonia Sours, MD;  Location: [place];  Service: Orthopedics;  Laterality: Left;    There were no vitals filed for this visit.  Visit Diagnosis:  Bilateral low back pain without sciatica  Weakness      Subjective Assessment - 03/27/15 0927     Subjective 4/10 currently, 4-6/10 at most for the past 2 days and when doing a lot of baking.    Patient Stated Goals Patient expresses desire to get better   Currently in Pain? Yes   Pain Score 4    Pain Location Back   Pain Orientation Lower   Pain Type Chronic pain   Pain Onset More than a month ago   Multiple Pain Sites No            [facility] Adult PT Treatment/Exercise - 03/27/15 0940    Knee/Hip Exercises: Supine   Other Supine Knee Exercises Directed patient with supine SLR L hip flexion 2 lbs 10x, then 3x; supine posterior pelvic tilts 10x3 with 10 second holds, supine hip flexion with isometric resistance from opposite UE 10x5 seconds each side. supine R hip extension isometrics (leg straight) 6x5 seconds, supine position with lumbar towel roll x 2 min. supine transversus abdominis contraction 10x 5 seconds, then with pelvic floor contraction 10x5 seconds for 3 sets, fast walk at the treadmill x 5 min at speed 1.9.    Knee/Hip Exercises: Sidelying   Other Sidelying Knee Exercises Directed patient with R S/L L trunk rotation 30 seconds x 3  decreased back pain to 2/10   Knee/Hip Exercises: Prone   Other Prone Exercises Quadruped L thoracic rotation 10x3, prayer stretch forward/R side/L side 30 seconds x 2 each  way,   Moist Heat Therapy   Number Minutes Moist Heat 15 Minutes   Moist Heat Location Lumbar Spine  sitting with lumbar towel roll           PT Education - 03/27/15 1908    Education provided Yes   Education Details ther-ex   [employer]) Educated Patient   Methods Explanation;Demonstration;Tactile cues;Verbal cues   Comprehension Verbalized understanding;Returned demonstration             PT Long Term Goals - 03/07/15 7035    PT LONG TERM GOAL #1   Title Patient will be independent with her HEP to decrease pain and increase function.   Time 4   Period Weeks   Status New   PT LONG TERM GOAL #2   Title Patient will have a decrease in back pain to 5/10 or  less at worst to improve ability to tolerate car rides and sitting.    Time 4   Period Weeks   Status New   PT LONG TERM GOAL #3   Title Patient will improve her Modified Oswestry Low Back Pain Disability questionnaire by at least 6 points as a demonstration of improved function.    Time 4   Period Weeks   Status New   PT LONG TERM GOAL #4   Title Patient will improve LE strength by 1/2 MMT grade to help decrease back pain and improve ability to perform functional tasks.    Time 4   Period Weeks   Status New               Plan - 03/27/15 1910    Clinical Impression Statement Difficulty tolerating the supine position secondary to back pain unless low back is supported with a towel roll. Back pain increased to 5/10 after the prayer stretch. Back pain decreased to 1-2/10 at end of session after sitting with a lunbar towel roll and a moist hot pack.    Pt will benefit from skilled therapeutic intervention in order to improve on the following deficits Difficulty walking;Postural dysfunction;Pain;Decreased strength;Impaired flexibility   Rehab Potential Good   Clinical Impairments Affecting Rehab Potential chronicity of condition   PT Frequency 2x / week   PT Duration 4 weeks   PT Treatment/Interventions Aquatic Therapy;Ultrasound;Traction;Gait training;Neuromuscular re-education;Patient/family education;Passive range of motion;Cryotherapy;Therapeutic activities;Electrical Stimulation;Therapeutic exercise;Manual techniques;Moist Heat   PT Next Visit Plan correct lumbopelvic positioning, immprove lumbar and thoracic mobility, improve hip strength and extension.   Consulted and Agree with Plan of Care Patient        Problem List There are no active problems to display for this patient.   Tyna Huertas 03/27/2015, 7:18 PM  [place] [address] [place], [address] Phone: [phone]   Fax:   [phone]

## 2015-03-27 NOTE — Patient Instructions (Signed)
Patient was recommended to use a hot pack for her low back 3x per day 15 minutes at a time to help decrease muscle tightness.   Improved exercise technique, movement at target joints, use of target muscles after mod verbal, visual, tactile cues.

## 2015-04-03 ENCOUNTER — Ambulatory Visit: Payer: BLUE CROSS/BLUE SHIELD

## 2015-04-03 DIAGNOSIS — M545 Low back pain, unspecified: Secondary | ICD-10-CM

## 2015-04-03 DIAGNOSIS — R531 Weakness: Secondary | ICD-10-CM

## 2015-04-03 NOTE — Therapy (Signed)
[place] [facility] [address], [place], [address] Phone: [phone]   Fax:  [phone]  Physical Therapy Treatment  Patient Details  Name: Jasmine Meadows MRN: [id number] Date of Birth: January 11, 1966 Referring Provider:  Kathyrn Lass, MD  Encounter Date: 04/03/2015      PT End of Session - 04/03/15 1042    Visit Number 6   Number of Visits 18   Date for PT Re-Evaluation 05/01/15   PT Start Time 7371   PT Stop Time 1155   PT Time Calculation (min) 73 min   Activity Tolerance Patient tolerated treatment well;No increased pain   Behavior During Therapy [facility] for tasks assessed/performed      Past Medical History  Diagnosis Date  . Environmental allergies   . Seasonal allergies   . PONV (postoperative nausea and vomiting)   . Uterine fibroid     Lupron injection every 3 mos.  . Carpal tunnel syndrome of right wrist 04/2012    Past Surgical History  Procedure Laterality Date  . Myomectomy    . Carpal tunnel release  03/19/2012    Procedure: CARPAL TUNNEL RELEASE;  Surgeon: Wynonia Sours, MD;  Location: [place];  Service: Orthopedics;  Laterality: Left;  . Carpal tunnel release  05/07/2012    Procedure: CARPAL TUNNEL RELEASE;  Surgeon: Wynonia Sours, MD;  Location: [place];  Service: Orthopedics;  Laterality: Right;  . Mass excision Left 03/31/2014    Procedure: EXCISION CYST ;  Surgeon: Wynonia Sours, MD;  Location: [place];  Service: Orthopedics;  Laterality: Left;  ANESTHESIA:  IV REGIONAL FAB  . Trigger finger release Left 03/31/2014    Procedure: RELEASE A-1 PULLEY LEFT RING FINGER;  Surgeon: Wynonia Sours, MD;  Location: [place];  Service: Orthopedics;  Laterality: Left;    There were no vitals filed for this visit.  Visit Diagnosis:  Bilateral low back pain without sciatica - Plan: PT plan of care cert/re-cert  Weakness - Plan: PT  plan of care cert/re-cert      Subjective Assessment - 04/03/15 1043    Subjective 4/10 currently. 5/10 at most this past weekend. Getting up in the morning is 8/10, sleeping bothers her back. Feels as if therapy helped her back. Standing up over an hour bothers her back. Walking a little over half a mile still bothers her back. 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 When she wears flat shoes or heels bothers her back.    Patient Stated Goals Patient expresses desire to get better   Currently in Pain? Yes   Pain Score 4    Pain Location Back   Pain Orientation Lower   Pain Type Chronic pain   Pain Onset More than a month ago   Multiple Pain Sites No            [facility] PT Assessment - 04/03/15 1224    Observation/Other Assessments   Oswestry Disability Index  36%            [facility] Adult PT Treatment/Exercise - 04/03/15 1046    Knee/Hip Exercises: Standing   Other Standing Knee Exercises standing posterior pelvic rocking 6x, then maintaining gentle posterior pelvic rock posture (given as part of her HEP; patient demonstrated understanding).    Knee/Hip Exercises: Seated   Other Seated Knee Exercises Directed patient with seated manually resited hip flexion, knee extension, knee flexion,  ankle DF, then S/L hip abduction, and prone glute max extension 1-2x each way for each LE. Reviewed progress with LE strength with patient.    Knee/Hip Exercises: Supine   Other Supine Knee Exercises Supine exercises with towel roll behind back. Directed patient with supine hip flexion with isometric resistance from opposite hand 10x5 seconds, then 6x5 second holds, supine posterior pelvic tilts 10x 5 seconds. No towel roll behind back but pillow under straight leg with opposite knee bent (hook lying): supine hip extension 10x5 seconds for 2 sets each LE (for medium level hip flexor stretch).    Knee/Hip Exercises: Sidelying   Other Sidelying Knee Exercises Directed patient with R S/L L  trunk rotation 30 seconds x 3  decreased back pain to 2/10   Manual Therapy   Manual therapy comments Prone with pillow under hips and abdomen, gentle manual lumbar distraction; prone soft tissue mobilization to L paraspinal muscles.             PT Education - 04/03/15 1231    Education provided Yes   Education Details Ther-ex, HEP, strength current status/progress.    Person(s) Educated Patient   Methods Explanation;Demonstration;Tactile cues;Verbal cues;Handout   Comprehension Verbalized understanding;Returned demonstration             PT Long Term Goals - 04/03/15 1258    PT LONG TERM GOAL #1   Title Patient will be independent with her HEP to decrease pain and increase function.   Time 4   Period Weeks   Status On-going   PT LONG TERM GOAL #2   Title Patient will have a decrease in back pain to 5/10 or less at worst to improve ability to tolerate car rides and sitting.    Time 4   Period Weeks   Status On-going   PT LONG TERM GOAL #3   Title Patient will improve her Modified Oswestry Low Back Pain Disability questionnaire by at least 6 points as a demonstration of improved function.    Time 4   Period Weeks   Status On-going   PT LONG TERM GOAL #4   Title Patient will improve LE strength by 1/2 MMT grade to help decrease back pain and improve ability to perform functional tasks.    Time 4   Period Weeks   Status Achieved               Plan - 04/03/15 1128    Clinical Impression Statement Strength: hip flexion 4+/5 bilaterally; knee extension 5/5 bilaterally; knee flexion 5/5 bilaterally; ankle DF 5/5 bilaterally; hip abduction R 4+/5, L 5/5; glute max extension 4+/5 bilaterally. Patient demonstrates improved LE strength and decreased low back pain at worst since initial evaluation.  Patient continues to feel low back pressure during standing and supine positions which make baking and sleeping challenging for her. Patient will benefit from continued skilled  physical therapy services to improve thoracic extension, hip extension, decrease pressure to low back, improve lumbo pelvic stability and abdominal strength to improve ability to bake, cook, perform her aerobic exercises and sleep,    Pt will benefit from skilled therapeutic intervention in order to improve on the following deficits Difficulty walking;Postural dysfunction;Pain;Decreased strength;Impaired flexibility   Rehab Potential Good   Clinical Impairments Affecting Rehab Potential chronicity of condition   PT Duration 4 weeks   PT Treatment/Interventions Aquatic Therapy;Ultrasound;Traction;Gait training;Neuromuscular re-education;Patient/family education;Passive range of motion;Cryotherapy;Therapeutic activities;Electrical Stimulation;Therapeutic exercise;Manual techniques;Moist Heat   PT Next Visit Plan Improve ability to maintain a more  neutral position for her low back, correct lumbopelvic positioning, immprove lumbar and thoracic mobility, improve hip strength and extension.   Consulted and Agree with Plan of Care Patient        Problem List There are no active problems to display for this patient.   Anyi Fels 04/03/2015, 10:43 PM  [place] [facility] [address] [place], [address] Phone: [phone]   Fax:  [phone]

## 2015-04-03 NOTE — Patient Instructions (Signed)
Patient was instructed to perform standing posterior pelvic rock then find to comfortable position and stay there to maintain a neutral low back posture. Patient demonstrated and verbalized understanding.   Gave supine posterior pelvic tilts with towel roll behind pelvis, and supine hip flexor stretch with pillow supporting knee (to prevent hyper extension to low back) as part of her HEP. Patient demonstrated and verbalized understanding.  Improved exercise technique, movement at target joints, use of target muscles after mod verbal, visual, tactile cues.

## 2015-04-05 ENCOUNTER — Ambulatory Visit: Payer: BLUE CROSS/BLUE SHIELD

## 2015-04-10 ENCOUNTER — Ambulatory Visit: Payer: BLUE CROSS/BLUE SHIELD

## 2015-04-10 DIAGNOSIS — R531 Weakness: Secondary | ICD-10-CM

## 2015-04-10 DIAGNOSIS — M545 Low back pain, unspecified: Secondary | ICD-10-CM

## 2015-04-10 NOTE — Patient Instructions (Addendum)
Pt was recommended to perform posterior and anterior pelvic tilts and find the middle spot and to try to maintain that posture throughout the day. Patient verbalized understanding.  Max verbal, visual, tactile cues to keep pelvis from rotating to the R and trunk from rotating to the left when performing static L LE mini lunge.   Max verbal, tactile cues for standing R hip pelvic dissociation exercise.   Improved exercise technique, movement at target joints, use of target muscles after mod verbal, visual, tactile cues with rest of exercises.

## 2015-04-10 NOTE — Therapy (Signed)
[place] [facility] [address] [place], [address] Phone: [phone]   Fax:  [phone]  Physical Therapy Treatment  Patient Details  Name: Jasmine Meadows MRN: [id number] Date of Birth: [birth year] Referring Provider:  Kathyrn Lass, MD  Encounter Date: 04/10/2015      PT End of Session - 04/10/15 1035    Visit Number 7   Number of Visits 18   Date for PT Re-Evaluation 05/01/15   PT Start Time 0998   PT Stop Time 1136   PT Time Calculation (min) 61 min   Activity Tolerance Patient tolerated treatment well;No increased pain   Behavior During Therapy [facility] for tasks assessed/performed      Past Medical History  Diagnosis Date  . Environmental allergies   . Seasonal allergies   . PONV (postoperative nausea and vomiting)   . Uterine fibroid     Lupron injection every 3 mos.  . Carpal tunnel syndrome of right wrist 04/2012    Past Surgical History  Procedure Laterality Date  . Myomectomy    . Carpal tunnel release  03/19/2012    Procedure: CARPAL TUNNEL RELEASE;  Surgeon: Wynonia Sours, MD;  Location: [place];  Service: Orthopedics;  Laterality: Left;  . Carpal tunnel release  05/07/2012    Procedure: CARPAL TUNNEL RELEASE;  Surgeon: Wynonia Sours, MD;  Location: [place];  Service: Orthopedics;  Laterality: Right;  . Mass excision Left 03/31/2014    Procedure: EXCISION CYST ;  Surgeon: Wynonia Sours, MD;  Location: [place];  Service: Orthopedics;  Laterality: Left;  ANESTHESIA:  IV REGIONAL FAB  . Trigger finger release Left 03/31/2014    Procedure: RELEASE A-1 PULLEY LEFT RING FINGER;  Surgeon: Wynonia Sours, MD;  Location: [place];  Service: Orthopedics;  Laterality: Left;    There were no vitals filed for this visit.  Visit Diagnosis:  Bilateral low back pain without sciatica  Weakness      Subjective Assessment - 04/10/15 1036    Subjective Pt states that her back bothered her last week and could not make it to therapy. Did exercises at home and used heat which helped. Today her back is a 4/10. Back bothers her most when she wakes up but feels better when she moves around as well as when she uses heat.    Patient Stated Goals Patient expresses desire to get better   Currently in Pain? Yes   Pain Score 4    Pain Location Back   Pain Orientation Lower   Pain Type Chronic pain   Pain Onset More than a month ago   Multiple Pain Sites No                         [facility] Adult PT Treatment/Exercise - 04/10/15 1050    Exercises   Other Exercises  Directed patient with Nustep with moist heat to low back with lumbar towel roll x5 min at level 4, standing static mini lunge with R and L LE 10x3, standing bilateral shoulder extension green band 10x3 with 5 second holds, standing R hip pelvic dissociation 11x, sitting on chair with lumbar towel roll. Patient was also recommended to perform anterior and posterior pelvic tilts and find the middle spot and to try to keep that posture throughout the day. Patient verbalized understanding.    Cryotherapy   Number Minutes Cryotherapy  15 Minutes   Cryotherapy Location Lumbar Spine  sitting with lumbar towel roll   Type of Cryotherapy Ice pack                PT Education - 04/10/15 1335    Education provided Yes   Education Details ther-ex, HEP   Person(s) Educated Patient   Methods Demonstration;Tactile cues;Verbal cues   Comprehension Verbalized understanding;Returned demonstration             PT Long Term Goals - 04/03/15 1258    PT LONG TERM GOAL #1   Title Patient will be independent with her HEP to decrease pain and increase function.   Time 4   Period Weeks   Status On-going   PT LONG TERM GOAL #2   Title Patient will have a decrease in back pain to 5/10 or less at worst to improve ability to tolerate car rides and sitting.    Time 4    Period Weeks   Status On-going   PT LONG TERM GOAL #3   Title Patient will improve her Modified Oswestry Low Back Pain Disability questionnaire by at least 6 points as a demonstration of improved function.    Time 4   Period Weeks   Status On-going   PT LONG TERM GOAL #4   Title Patient will improve LE strength by 1/2 MMT grade to help decrease back pain and improve ability to perform functional tasks.    Time 4   Period Weeks   Status Achieved               Plan - 04/10/15 1118    Clinical Impression Statement Standing posture: R pelvic rotation with L trunk rotation. Decreased L low back pressure with L LE lunge with proper back and pelvic posture. Also demonstrates difficulty with hip pelvic lumbar dissociation   Pt will benefit from skilled therapeutic intervention in order to improve on the following deficits Difficulty walking;Postural dysfunction;Pain;Decreased strength;Impaired flexibility   Rehab Potential Good   Clinical Impairments Affecting Rehab Potential chronicity of condition   PT Frequency 2x / week   PT Duration 4 weeks   PT Treatment/Interventions Aquatic Therapy;Ultrasound;Traction;Gait training;Neuromuscular re-education;Patient/family education;Passive range of motion;Cryotherapy;Therapeutic activities;Electrical Stimulation;Therapeutic exercise;Manual techniques;Moist Heat   PT Next Visit Plan Improve ability to maintain a more neutral position for her low back, correct lumbopelvic positioning, immprove lumbar and thoracic mobility, improve hip strength and extension.   Consulted and Agree with Plan of Care Patient        Problem List There are no active problems to display for this patient.   Shanna Strength 04/10/2015, 7:02 PM  [place] [facility] [address], [place], [address] Phone: [phone]   Fax:  [phone]

## 2015-04-12 ENCOUNTER — Ambulatory Visit: Payer: BLUE CROSS/BLUE SHIELD

## 2015-04-12 DIAGNOSIS — R531 Weakness: Secondary | ICD-10-CM

## 2015-04-12 DIAGNOSIS — M545 Low back pain, unspecified: Secondary | ICD-10-CM

## 2015-04-12 NOTE — Therapy (Signed)
[place] [facility] [address] [place], [address] Phone: [phone]   Fax:  [phone]  Physical Therapy Treatment  Patient Details  Name: Jasmine Meadows MRN: [id number] Date of Birth: Dec 09, 1965 Referring Provider:  Kathyrn Lass, MD  Encounter Date: 04/12/2015      PT End of Session - 04/12/15 1135    Visit Number 8   Number of Visits 18   Date for PT Re-Evaluation 05/01/15   PT Start Time 1033   PT Stop Time 1696   PT Time Calculation (min) 51 min   Activity Tolerance Patient tolerated treatment well;No increased pain   Behavior During Therapy [facility] for tasks assessed/performed      Past Medical History  Diagnosis Date  . Environmental allergies   . Seasonal allergies   . PONV (postoperative nausea and vomiting)   . Uterine fibroid     Lupron injection every 3 mos.  . Carpal tunnel syndrome of right wrist 04/2012    Past Surgical History  Procedure Laterality Date  . Myomectomy    . Carpal tunnel release  03/19/2012    Procedure: CARPAL TUNNEL RELEASE;  Surgeon: Wynonia Sours, MD;  Location: [place];  Service: Orthopedics;  Laterality: Left;  . Carpal tunnel release  05/07/2012    Procedure: CARPAL TUNNEL RELEASE;  Surgeon: Wynonia Sours, MD;  Location: [place];  Service: Orthopedics;  Laterality: Right;  . Mass excision Left 03/31/2014    Procedure: EXCISION CYST ;  Surgeon: Wynonia Sours, MD;  Location: [place];  Service: Orthopedics;  Laterality: Left;  ANESTHESIA:  IV REGIONAL FAB  . Trigger finger release Left 03/31/2014    Procedure: RELEASE A-1 PULLEY LEFT RING FINGER;  Surgeon: Wynonia Sours, MD;  Location: [place];  Service: Orthopedics;  Laterality: Left;    There were no vitals filed for this visit.  Visit Diagnosis:  Bilateral low back pain without sciatica  Weakness      Subjective Assessment - 04/12/15 1041    Subjective 3/10 back pain currently. Today is the best day for back in a while. 3-4/10 at worst for the past 2 days   Patient Stated Goals Patient expresses desire to get better   Currently in Pain? Yes   Pain Score 3    Pain Location Back   Pain Orientation Lower   Pain Type Chronic pain   Pain Onset More than a month ago   Multiple Pain Sites No            [facility] Adult PT Treatment/Exercise - 04/12/15 1132    Exercises   Other Exercises  Directed patient with Nustep with moist heat to low back with lumbar towel roll x5 min at level 4 (no charge), standing static mini lunge with R and L LE 10x3, standing bilateral shoulder extension green band 10x3 with 5 second holds, standing hip pelvic dissociation 10x3 each LE, sitting on chair with lumbar towel roll and thoracic towel roll with thoracic extension 10x3 with 5 second holds (given as part of her HEP; please see pt instruction section), standing mini squats emphasizing L LE use (L LE weight shift) 10x2.            PT Education - 04/12/15 1143    Education provided Yes   Education Details ther-ex, HEP   Person(s) Educated Patient   Methods Explanation;Demonstration;Tactile cues;Verbal cues;Handout   Comprehension Verbalized understanding;Returned demonstration  PT Long Term Goals - 04/03/15 1258    PT LONG TERM GOAL #1   Title Patient will be independent with her HEP to decrease pain and increase function.   Time 4   Period Weeks   Status On-going   PT LONG TERM GOAL #2   Title Patient will have a decrease in back pain to 5/10 or less at worst to improve ability to tolerate car rides and sitting.    Time 4   Period Weeks   Status On-going   PT LONG TERM GOAL #3   Title Patient will improve her Modified Oswestry Low Back Pain Disability questionnaire by at least 6 points as a demonstration of improved function.    Time 4   Period Weeks   Status On-going   PT LONG TERM GOAL #4   Title Patient will  improve LE strength by 1/2 MMT grade to help decrease back pain and improve ability to perform functional tasks.    Time 4   Period Weeks   Status Achieved               Plan - 04/12/15 1127    Clinical Impression Statement Back pain decreased to 2-3/10 after session. Needed mod to max cues for lumbopelvic stability and posture. Still demonstrates increased lumbar movement and decreased thoracic movement. Difficulty with hip pelvic dissociation L LE compared to R LE.  Continue with thoracic extension, lumbopelvic control, L glute max strengthening, bilateral hip pelvic dissociation.    Pt will benefit from skilled therapeutic intervention in order to improve on the following deficits Difficulty walking;Postural dysfunction;Pain;Decreased strength;Impaired flexibility   Rehab Potential Good   Clinical Impairments Affecting Rehab Potential chronicity of condition   PT Frequency 2x / week   PT Duration 4 weeks   PT Treatment/Interventions Aquatic Therapy;Ultrasound;Traction;Gait training;Neuromuscular re-education;Patient/family education;Passive range of motion;Cryotherapy;Therapeutic activities;Electrical Stimulation;Therapeutic exercise;Manual techniques;Moist Heat   PT Next Visit Plan Improve ability to maintain a more neutral position for her low back, correct lumbopelvic positioning, improve lumbar and thoracic mobility, improve hip strength and extension, lumbopelvic stability, glute med and max strength   Consulted and Agree with Plan of Care Patient        Problem List There are no active problems to display for this patient.   Laygo,Miguel 04/12/2015, 11:47 AM  [place] [facility] [address] [place], [address] Phone: [phone]   Fax:  [phone]

## 2015-04-12 NOTE — Patient Instructions (Addendum)
(  Home) Extension: Thoracic With Lumbar Lock - Sitting   Place low back towel roll behind you. Sit with back against chair, knees bent, hands locked behind head. Breathe in, extending head and trunk over chair back. Breathe out. Hold position for ___5 breaths. Repeat __10__ times per set. Do __3__ sets per session. Do __1__ sessions per day.  Copyright  [employer]. All rights reserved.    Improved exercise technique, movement at target joints, use of target muscles after mod to max verbal, visual, tactile cues.

## 2015-04-17 ENCOUNTER — Ambulatory Visit: Payer: BLUE CROSS/BLUE SHIELD

## 2015-04-19 ENCOUNTER — Ambulatory Visit: Payer: BLUE CROSS/BLUE SHIELD

## 2015-04-19 DIAGNOSIS — R531 Weakness: Secondary | ICD-10-CM

## 2015-04-19 DIAGNOSIS — M545 Low back pain, unspecified: Secondary | ICD-10-CM

## 2015-04-19 NOTE — Therapy (Signed)
[facility] [address], [place], [address] Phone: [phone]   Fax:  [phone]  Physical Therapy Treatment  Patient Details  Name: Jasmine Meadows MRN: [id number] Date of Birth: 29-Dec-1965 Referring Provider:  Kathyrn Lass, MD  Encounter Date: 04/19/2015      PT End of Session - 04/19/15 0803    Visit Number 9   Number of Visits 18   Date for PT Re-Evaluation 05/01/15   PT Start Time 0803   PT Stop Time 0909   PT Time Calculation (min) 66 min   Activity Tolerance Patient tolerated treatment well;No increased pain   Behavior During Therapy [facility] for tasks assessed/performed      Past Medical History  Diagnosis Date  . Environmental allergies   . Seasonal allergies   . PONV (postoperative nausea and vomiting)   . Uterine fibroid     Lupron injection every 3 mos.  . Carpal tunnel syndrome of right wrist 04/2012    Past Surgical History  Procedure Laterality Date  . Myomectomy    . Carpal tunnel release  03/19/2012    Procedure: CARPAL TUNNEL RELEASE;  Surgeon: Wynonia Sours, MD;  Location: [place];  Service: Orthopedics;  Laterality: Left;  . Carpal tunnel release  05/07/2012    Procedure: CARPAL TUNNEL RELEASE;  Surgeon: Wynonia Sours, MD;  Location: [place];  Service: Orthopedics;  Laterality: Right;  . Mass excision Left 03/31/2014    Procedure: EXCISION CYST ;  Surgeon: Wynonia Sours, MD;  Location: [place];  Service: Orthopedics;  Laterality: Left;  ANESTHESIA:  IV REGIONAL FAB  . Trigger finger release Left 03/31/2014    Procedure: RELEASE A-1 PULLEY LEFT RING FINGER;  Surgeon: Wynonia Sours, MD;  Location: [place];  Service: Orthopedics;  Laterality: Left;    There were no vitals filed for this visit.  Visit Diagnosis:  Bilateral low back pain without sciatica  Weakness      Subjective Assessment - 04/19/15 0808     Subjective Pt states that today is not a good day for her back. 6/10 currently. Wants to start with a heating pad for he back. 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 Has been working on [employer].    Patient Stated Goals Patient expresses desire to get better   Pain Score 6    Pain Location Back   Pain Orientation Lower   Pain Type Chronic pain   Pain Onset More than a month ago   Multiple Pain Sites No           [facility] Adult PT Treatment/Exercise - 04/19/15 0809    Exercises   Other Exercises  Directed patient with seated R trunk rotation 10x3 with 5 second holds, seated with Lumbar locked L trunk rotation 10x3 with 5 second holds, then 4x10 second holds with manual pressure to L low back; sitting with lumbar towel roll and thoracic towel roll back extension 10x3 with 10 second holds; supine transversus abdominis contraction 10x5 seconds, then with pelvic floor contractions (with towel roll under lumbar and thoracic spine) 10x2 with 5 second holds. seated soleus stretch 30 seconds x2 each LE (given as part of her HEP; pt demonstrated and verbalized understanding)    Moist Heat Therapy   Number Minutes Moist Heat 15 Minutes   Moist Heat Location Lumbar Spine  sitting with lumbar towel roll   Manual Therapy   Manual therapy comments supine manual lumbar traction                     PT Long Term Goals - 04/03/15 1258    PT LONG TERM GOAL #1   Title Patient will be independent with her HEP to decrease pain and increase function.   Time 4   Period Weeks   Status On-going   PT LONG TERM GOAL #2   Title Patient will have a decrease in back pain to 5/10 or less at worst to improve ability to tolerate car rides and sitting.    Time 4   Period Weeks   Status On-going   PT LONG TERM GOAL #3   Title Patient will improve her Modified Oswestry Low Back Pain Disability questionnaire by at least 6 points as a  demonstration of improved function.    Time 4   Period Weeks   Status On-going   PT LONG TERM GOAL #4   Title Patient will improve LE strength by 1/2 MMT grade to help decrease back pain and improve ability to perform functional tasks.    Time 4   Period Weeks   Status Achieved               Plan - 04/19/15 0910    Clinical Impression Statement Decreased back pain to 4/10 after manual lumbar traction. Upon assessment of pt ergonomic lifting, bilateral knees went over her toes as well as decreased femoral control. Good back posture. 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   Pt will benefit from skilled therapeutic intervention in order to improve on the following deficits Difficulty walking;Postural dysfunction;Pain;Decreased strength;Impaired flexibility   Rehab Potential Good   Clinical Impairments Affecting Rehab Potential chronicity of condition   PT Frequency 2x / week   PT Duration 4 weeks   PT Treatment/Interventions Aquatic Therapy;Ultrasound;Traction;Gait training;Neuromuscular re-education;Patient/family education;Passive range of motion;Cryotherapy;Therapeutic activities;Electrical Stimulation;Therapeutic exercise;Manual techniques;Moist Heat   PT Next Visit Plan Improve ability to maintain a more neutral position for her low back, correct lumbopelvic positioning, improve lumbar and thoracic mobility, improve hip strength and extension, lumbopelvic stability, glute med and max strength   Consulted and Agree with Plan of Care Patient        Problem List There are no active problems to display for this patient.   Jasmine Meadows 04/19/2015, 10:21 AM  [place] [facility] [address], [place], [address] Phone: [phone]   Fax:  [phone]

## 2015-04-19 NOTE — Patient Instructions (Signed)
Gave supine transversus abdominis and pelvic floor contractions and seated soleus stretch (30 seconds x 6 each LE) as part of her HEP. Pt demonstrated and verbalized understanding.  Improved exercise technique, movement at target joints, use of target muscles after mod verbal, visual, tactile cues.

## 2015-04-20 ENCOUNTER — Ambulatory Visit: Payer: BLUE CROSS/BLUE SHIELD

## 2015-04-30 ENCOUNTER — Ambulatory Visit
Admission: RE | Admit: 2015-04-30 | Discharge: 2015-04-30 | Disposition: A | Discharge status: Home / Self Care | Payer: BLUE CROSS/BLUE SHIELD | Source: Ambulatory Visit

## 2015-04-30 DIAGNOSIS — Z1231 Encounter for screening mammogram for malignant neoplasm of breast: Secondary | ICD-10-CM

## 2015-05-03 ENCOUNTER — Ambulatory Visit: Payer: BLUE CROSS/BLUE SHIELD

## 2015-05-08 ENCOUNTER — Ambulatory Visit: Payer: BLUE CROSS/BLUE SHIELD | Attending: Family Medicine

## 2015-05-10 ENCOUNTER — Ambulatory Visit: Payer: BLUE CROSS/BLUE SHIELD

## 2015-06-07 ENCOUNTER — Ambulatory Visit
Admission: RE | Admit: 2015-06-07 | Discharge: 2015-06-07 | Disposition: A | Discharge status: Home / Self Care | Payer: BLUE CROSS/BLUE SHIELD | Source: Ambulatory Visit | Attending: Family Medicine | Admitting: Family Medicine

## 2015-06-07 ENCOUNTER — Other Ambulatory Visit: Payer: Self-pay | Admitting: Family Medicine

## 2015-06-07 DIAGNOSIS — M545 Low back pain: Secondary | ICD-10-CM

## 2015-08-20 DIAGNOSIS — D0512 Intraductal carcinoma in situ of left breast: Secondary | ICD-10-CM | POA: Insufficient documentation

## 2016-02-13 DIAGNOSIS — Z79899 Other long term (current) drug therapy: Secondary | ICD-10-CM | POA: Diagnosis not present

## 2016-02-13 DIAGNOSIS — D0512 Intraductal carcinoma in situ of left breast: Secondary | ICD-10-CM | POA: Diagnosis not present

## 2016-02-13 DIAGNOSIS — Z923 Personal history of irradiation: Secondary | ICD-10-CM | POA: Diagnosis not present

## 2016-02-13 DIAGNOSIS — Z170 Estrogen receptor positive status [ER+]: Secondary | ICD-10-CM | POA: Diagnosis not present

## 2016-02-13 DIAGNOSIS — Z7982 Long term (current) use of aspirin: Secondary | ICD-10-CM | POA: Diagnosis not present

## 2016-03-19 DIAGNOSIS — D0512 Intraductal carcinoma in situ of left breast: Secondary | ICD-10-CM | POA: Diagnosis not present

## 2016-03-19 DIAGNOSIS — Z9889 Other specified postprocedural states: Secondary | ICD-10-CM | POA: Diagnosis not present

## 2016-03-19 DIAGNOSIS — R928 Other abnormal and inconclusive findings on diagnostic imaging of breast: Secondary | ICD-10-CM | POA: Diagnosis not present

## 2016-03-19 DIAGNOSIS — Z923 Personal history of irradiation: Secondary | ICD-10-CM | POA: Diagnosis not present

## 2016-03-19 DIAGNOSIS — C50919 Malignant neoplasm of unspecified site of unspecified female breast: Secondary | ICD-10-CM | POA: Diagnosis not present

## 2016-04-07 DIAGNOSIS — G4700 Insomnia, unspecified: Secondary | ICD-10-CM | POA: Diagnosis not present

## 2016-04-07 DIAGNOSIS — F4320 Adjustment disorder, unspecified: Secondary | ICD-10-CM | POA: Diagnosis not present

## 2016-04-15 DIAGNOSIS — F430 Acute stress reaction: Secondary | ICD-10-CM | POA: Diagnosis not present

## 2016-05-08 DIAGNOSIS — F411 Generalized anxiety disorder: Secondary | ICD-10-CM | POA: Diagnosis not present

## 2016-05-12 DIAGNOSIS — D0512 Intraductal carcinoma in situ of left breast: Secondary | ICD-10-CM | POA: Diagnosis not present

## 2016-05-23 DIAGNOSIS — F411 Generalized anxiety disorder: Secondary | ICD-10-CM | POA: Diagnosis not present

## 2016-06-12 DIAGNOSIS — M65342 Trigger finger, left ring finger: Secondary | ICD-10-CM | POA: Diagnosis not present

## 2016-06-13 DIAGNOSIS — D0512 Intraductal carcinoma in situ of left breast: Secondary | ICD-10-CM | POA: Diagnosis not present

## 2016-06-16 DIAGNOSIS — Z6835 Body mass index (BMI) 35.0-35.9, adult: Secondary | ICD-10-CM | POA: Diagnosis not present

## 2016-06-16 DIAGNOSIS — Z1151 Encounter for screening for human papillomavirus (HPV): Secondary | ICD-10-CM | POA: Diagnosis not present

## 2016-06-16 DIAGNOSIS — Z01419 Encounter for gynecological examination (general) (routine) without abnormal findings: Secondary | ICD-10-CM | POA: Diagnosis not present

## 2016-06-18 DIAGNOSIS — F411 Generalized anxiety disorder: Secondary | ICD-10-CM | POA: Diagnosis not present

## 2016-07-09 DIAGNOSIS — M65842 Other synovitis and tenosynovitis, left hand: Secondary | ICD-10-CM | POA: Diagnosis not present

## 2016-08-04 DIAGNOSIS — D0512 Intraductal carcinoma in situ of left breast: Secondary | ICD-10-CM | POA: Diagnosis not present

## 2016-08-04 DIAGNOSIS — N62 Hypertrophy of breast: Secondary | ICD-10-CM | POA: Diagnosis not present

## 2016-10-01 DIAGNOSIS — D0510 Intraductal carcinoma in situ of unspecified breast: Secondary | ICD-10-CM | POA: Diagnosis not present

## 2016-10-03 DIAGNOSIS — D0512 Intraductal carcinoma in situ of left breast: Secondary | ICD-10-CM | POA: Diagnosis not present

## 2016-10-08 DIAGNOSIS — M79642 Pain in left hand: Secondary | ICD-10-CM | POA: Diagnosis not present

## 2016-10-08 DIAGNOSIS — M79641 Pain in right hand: Secondary | ICD-10-CM | POA: Diagnosis not present

## 2016-10-15 DIAGNOSIS — Z0000 Encounter for general adult medical examination without abnormal findings: Secondary | ICD-10-CM | POA: Diagnosis not present

## 2016-10-31 DIAGNOSIS — D0510 Intraductal carcinoma in situ of unspecified breast: Secondary | ICD-10-CM | POA: Diagnosis not present

## 2016-10-31 DIAGNOSIS — D0512 Intraductal carcinoma in situ of left breast: Secondary | ICD-10-CM | POA: Diagnosis not present

## 2016-10-31 DIAGNOSIS — R928 Other abnormal and inconclusive findings on diagnostic imaging of breast: Secondary | ICD-10-CM | POA: Diagnosis not present

## 2016-11-06 ENCOUNTER — Ambulatory Visit: Payer: BLUE CROSS/BLUE SHIELD | Admitting: Neurology

## 2016-11-22 DIAGNOSIS — J069 Acute upper respiratory infection, unspecified: Secondary | ICD-10-CM | POA: Diagnosis not present

## 2016-11-22 DIAGNOSIS — R51 Headache: Secondary | ICD-10-CM | POA: Diagnosis not present

## 2016-11-22 DIAGNOSIS — R05 Cough: Secondary | ICD-10-CM | POA: Diagnosis not present

## 2016-12-30 ENCOUNTER — Telehealth: Payer: Self-pay | Admitting: *Deleted

## 2016-12-30 ENCOUNTER — Ambulatory Visit: Payer: BLUE CROSS/BLUE SHIELD | Admitting: Neurology

## 2016-12-30 NOTE — Telephone Encounter (Signed)
No showed new patient appointment. 

## 2016-12-31 ENCOUNTER — Encounter: Payer: Self-pay | Admitting: Neurology

## 2017-01-12 ENCOUNTER — Encounter: Payer: Self-pay | Admitting: Neurology

## 2017-01-12 ENCOUNTER — Ambulatory Visit (INDEPENDENT_AMBULATORY_CARE_PROVIDER_SITE_OTHER): Payer: BLUE CROSS/BLUE SHIELD | Admitting: Neurology

## 2017-01-12 DIAGNOSIS — R202 Paresthesia of skin: Secondary | ICD-10-CM | POA: Diagnosis not present

## 2017-01-12 DIAGNOSIS — R531 Weakness: Secondary | ICD-10-CM | POA: Insufficient documentation

## 2017-01-12 DIAGNOSIS — R292 Abnormal reflex: Secondary | ICD-10-CM | POA: Diagnosis not present

## 2017-01-12 NOTE — Progress Notes (Signed)
PATIENT: Jasmine Meadows DOB: September 19, 1966  Chief Complaint  Patient presents with  . Hand Pain    Reports pain, numbness, swelling and spasms in her  hands. Symptoms worsened after having carpal tunnel and cyst removal surgery.    Marland Kitchen PCP    Kathyrn Lass, MD     HISTORICAL  Jasmine Meadows is a 51 year old right-handed female, seen in refer by her primary care doctor  Kathyrn Lass for evaluation of hand pain and swelling, initial evaluation was on January 12 2017.  I reviewed and summarized the referral note, she had a history of left breast cancer, incidental finding of left breast cancer doing left breast reduction surgery, followed by radiation treatment, also had a history of PTSD, She reported a history of bilateral carpal tunnel surgery by Dr. 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  REVIEW OF SYSTEMS: Full 14 system review of systems performed and notable only for numbness, weakness  ALLERGIES: Allergies  Allergen Reactions  . Other Anaphylaxis    HOME MEDICATIONS: Current Outpatient Prescriptions  Medication Sig Dispense  Refill  . aspirin EC 81 MG tablet Take 81 mg by mouth daily.    . calcium carbonate (OS-CAL) 600 MG TABS Take 600 mg by mouth 2 (two) times daily with a meal.    . cetirizine (ZYRTEC) 5 MG tablet Take 5 mg by mouth daily.    . Multiple Vitamin (MULTI-VITAMINS) TABS Take 1 tablet by mouth daily.    . tamoxifen (NOLVADEX) 20 MG tablet Take 20 mg by mouth daily.     No current facility-administered medications for this visit.     PAST MEDICAL HISTORY: Past Medical History:  Diagnosis Date  . Bilateral hand pain   . Breast cancer ([place])    left - radiation only  . Carpal tunnel syndrome of left wrist   . Carpal tunnel syndrome of right wrist 04/2012  . Environmental allergies   . PONV (postoperative nausea and vomiting)   . Seasonal allergies   . Uterine fibroid    Lupron injection every 3 mos.    PAST SURGICAL HISTORY: Past Surgical History:  Procedure Laterality Date  . BREAST REDUCTION SURGERY    . CARPAL TUNNEL RELEASE  03/19/2012   Procedure: CARPAL TUNNEL RELEASE;  Surgeon: Wynonia Sours, MD;  Location: [place];  Service: Orthopedics;  Laterality: Left;  . CARPAL TUNNEL RELEASE  05/07/2012   Procedure: CARPAL TUNNEL RELEASE;  Surgeon: Wynonia Sours, MD;  Location: [place];  Service: Orthopedics;  Laterality: Right;  . MASS EXCISION Left 03/31/2014   Procedure: EXCISION CYST ;  Surgeon: Wynonia Sours, MD;  Location: [facility]  [place];  Service: Orthopedics;  Laterality: Left;  ANESTHESIA:  IV REGIONAL FAB  . MYOMECTOMY    . TRIGGER FINGER RELEASE Left 03/31/2014   Procedure: RELEASE A-1 PULLEY LEFT RING FINGER;  Surgeon: Wynonia Sours, MD;  Location: [place];  Service: Orthopedics;  Laterality: Left;    FAMILY HISTORY: Family History  Problem Relation Age of Onset  . Asthma Other   . Hypertension Other   . Transient ischemic attack Mother   . Hypertension Mother   . Asthma Mother   . Dementia Father     SOCIAL  HISTORY:  Social History   Social History  . Marital status: Married    Spouse name: N/A  . Number of children: 1  . Years of education: HS   Occupational History  . 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 Smoking status: Never Smoker  . Smokeless tobacco: Never Used  . Alcohol use No  . Drug use: No  . Sexual activity: Not on file   Other Topics Concern  . Not on file   Social History Narrative   Lives at home with husband.   Right-handed.   No caffeine use.     PHYSICAL EXAM   Vitals:   01/12/17 0908  BP: 128/86  Pulse: 90  Weight: 195 lb (88.5 kg)  Height: 5\' 2"  (1.575 m)    Not recorded      Body mass index is 35.67 kg/m.  PHYSICAL EXAMNIATION:  Gen: NAD, conversant, well nourised, obese, well groomed                     Cardiovascular: Regular rate rhythm, no peripheral edema, warm, nontender. Eyes: Conjunctivae clear without exudates or hemorrhage Neck: Supple, no carotid bruits. Pulmonary: Clear to auscultation bilaterally   NEUROLOGICAL EXAM:  MENTAL STATUS: Speech:    Speech is normal; fluent and spontaneous with normal comprehension.  Cognition:     Orientation to time, place and person     Normal recent and remote memory     Normal Attention span and concentration     Normal Language, naming, repeating,spontaneous speech     Fund of knowledge   CRANIAL NERVES: CN II: Visual fields are full to confrontation. Fundoscopic exam is normal with sharp discs and no vascular changes. Pupils are round equal and briskly reactive to light. CN III, IV, VI: extraocular movement are normal. No ptosis. CN V: Facial sensation is intact to pinprick in all 3 divisions bilaterally. Corneal responses are intact.  CN VII: Face is symmetric with normal eye closure and smile. CN VIII: Hearing is normal to rubbing fingers CN IX, X: Palate elevates symmetrically. Phonation is normal. CN XI: Head turning and shoulder shrug are intact CN XII:  Tongue is midline with normal movements and no atrophy.  MOTOR: There is no pronator drift of out-stretched arms. Muscle bulk and tone are normal. Muscle strength is normal.  REFLEXES: Reflexes are 2+ and symmetric at the biceps, triceps, knees, and ankles. Plantar responses are flexor.  SENSORY: Intact to light touch, pinprick, positional sensation and vibratory sensation are intact in fingers and toes.  COORDINATION: Rapid alternating movements and fine finger movements are intact. There is no dysmetria on finger-to-nose and heel-knee-shin.    GAIT/STANCE: Posture is normal. Gait is steady with normal steps, base, arm swing, and turning. Heel and toe walking are normal. Tandem gait is normal.  Romberg is absent.  DIAGNOSTIC DATA (LABS, IMAGING, TESTING) - I reviewed patient records, labs, notes, testing and imaging myself where available.   ASSESSMENT AND PLAN  Jasmine Meadows is a 51 y.o. female   Bilateral hands paresthesia, pain and swelling  Differentiation diagnosis include cervical myelopathy, carpal tunnel syndromes  Proceed with MRI of cervical spine  Laboratory evaluations to rule out inflammatory markers  EMG nerve conduction study   Marcial Pacas, M.D. Ph.D.  [facility] [address] [place], [place] [address] Ph: [phone] Fax: [phone]  XY:IAXK Sabra Heck, MD

## 2017-01-13 LAB — CK: CK TOTAL: 127 U/L (ref 24–173)

## 2017-01-13 LAB — COMPREHENSIVE METABOLIC PANEL
A/G RATIO: 1.5 (ref 1.2–2.2)
ALBUMIN: 4.2 g/dL (ref 3.5–5.5)
ALT: 12 IU/L (ref 0–32)
AST: 14 IU/L (ref 0–40)
Alkaline Phosphatase: 80 IU/L (ref 39–117)
BUN / CREAT RATIO: 21 (ref 9–23)
BUN: 15 mg/dL (ref 6–24)
Bilirubin Total: <0.2 mg/dL (ref 0.0–1.2)
CO2: 24 mmol/L (ref 18–29)
Calcium: 9.2 mg/dL (ref 8.7–10.2)
Chloride: 104 mmol/L (ref 96–106)
Creatinine, Ser: 0.73 mg/dL (ref 0.57–1.00)
GFR calc Af Amer: 110 mL/min/{1.73_m2} (ref >59)
GFR calc non Af Amer: 96 mL/min/{1.73_m2} (ref >59)
Globulin, Total: 2.8 g/dL (ref 1.5–4.5)
Glucose: 108 mg/dL — ABNORMAL HIGH (ref 65–99)
POTASSIUM: 4.9 mmol/L (ref 3.5–5.2)
Sodium: 143 mmol/L (ref 134–144)
TOTAL PROTEIN: 7 g/dL (ref 6.0–8.5)

## 2017-01-13 LAB — CBC
Hematocrit: 39 % (ref 34.0–46.6)
Hemoglobin: 13 g/dL (ref 11.1–15.9)
MCH: 28.4 pg (ref 26.6–33.0)
MCHC: 33.3 g/dL (ref 31.5–35.7)
MCV: 85 fL (ref 79–97)
Platelets: 233 10*3/uL (ref 150–379)
RBC: 4.58 x10E6/uL (ref 3.77–5.28)
RDW: 14.1 % (ref 12.3–15.4)
WBC: 5.4 10*3/uL (ref 3.4–10.8)

## 2017-01-13 LAB — ANA W/REFLEX IF POSITIVE: Anti Nuclear Antibody(ANA): NEGATIVE

## 2017-01-13 LAB — TSH: TSH: 1.65 u[IU]/mL (ref 0.450–4.500)

## 2017-01-13 LAB — SEDIMENTATION RATE: Sed Rate: 4 mm/hr (ref 0–40)

## 2017-01-14 ENCOUNTER — Telehealth: Payer: Self-pay | Admitting: *Deleted

## 2017-01-14 NOTE — Telephone Encounter (Signed)
Spoke to patient she is aware of results

## 2017-01-14 NOTE — Telephone Encounter (Signed)
Left message for a return call

## 2017-01-14 NOTE — Telephone Encounter (Signed)
-----   Message from Marcial Pacas, MD sent at 01/14/2017  9:54 AM EDT ----- Please call patient for no significant abnormality on laboratory evaluations.

## 2017-01-28 ENCOUNTER — Other Ambulatory Visit: Payer: BLUE CROSS/BLUE SHIELD

## 2017-02-13 ENCOUNTER — Ambulatory Visit (INDEPENDENT_AMBULATORY_CARE_PROVIDER_SITE_OTHER): Payer: Self-pay | Admitting: Neurology

## 2017-02-13 ENCOUNTER — Ambulatory Visit (INDEPENDENT_AMBULATORY_CARE_PROVIDER_SITE_OTHER): Payer: BLUE CROSS/BLUE SHIELD | Admitting: Neurology

## 2017-02-13 DIAGNOSIS — R202 Paresthesia of skin: Secondary | ICD-10-CM | POA: Diagnosis not present

## 2017-02-13 DIAGNOSIS — R292 Abnormal reflex: Secondary | ICD-10-CM

## 2017-02-13 DIAGNOSIS — R531 Weakness: Secondary | ICD-10-CM

## 2017-02-13 DIAGNOSIS — Z0289 Encounter for other administrative examinations: Secondary | ICD-10-CM

## 2017-02-13 DIAGNOSIS — G5600 Carpal tunnel syndrome, unspecified upper limb: Secondary | ICD-10-CM | POA: Insufficient documentation

## 2017-02-13 DIAGNOSIS — G5603 Carpal tunnel syndrome, bilateral upper limbs: Secondary | ICD-10-CM

## 2017-02-13 MED ORDER — DICLOFENAC SODIUM 1 % TD GEL: 4.0000 g | Freq: Four times a day (QID) | TRANSDERMAL | 11 refills | Status: DC

## 2017-02-13 NOTE — Procedures (Signed)
Full Name: Jasmine Meadows Gender: Female MRN #: [id number] Date of Birth: 1966/02/11    Visit Date: 02/13/2017 10:45 Age: 51 Years 31 Months Old Examining Physician: Marcial Pacas, MD  Referring Physician: Krista Blue, MD History: 51 years old right-handed female, with history of carpal tunnel release surgery in 2017, presented with few months history of bilateral finger paresthesia, bilateral hand deep achy pain, difficulty to make a tight fist,  Summary of the test: Nerve conduction study: Bilateral median mixed response with prolonged in comparison to bilateral ulnar mixed response, right side is 1.30 milliseconds, left side is 0.37 milliseconds prolonged.  Bilateral median and ulnar motor responses were normal.  Electromyography: Selected needle examination of bilateral upper extremity and bilateral cervical paraspinals was normal.   Conclusion: This is a mild abnormal study. There is electrodiagnostic evidence of bilateral median neuropathy across the wrist, consistent with mild bilateral carpal tunnel syndromes, right worse than left. There is no electrodiagnostic evidence of bilateral cervical radiculopathy.     ------------------------------- Marcial Pacas, M.D.  [facility] [place], [place] [address] Tel: [phone] Fax: [phone]        [facility]    Nerve / Sites Rec. Site Latency Ref. Amplitude Ref. Rel Amp Segments Distance Velocity Ref. Area    ms ms mV mV %  cm m/s m/s mVms  L Median - APB     Wrist APB 4.0 ?4.4 15.1 ?4.0 100 Wrist - APB 7   43.8     Upper arm APB 7.3  14.6  97 Upper arm - Wrist 20 59 ?49 44.0  R Median - APB     Wrist APB 4.2 ?4.4 9.7 ?4.0 100 Wrist - APB 7   34.2     Upper arm APB 7.6  9.4  96.8 Upper arm - Wrist 20 58 ?49 34.1  L Ulnar - ADM     Wrist ADM 2.8 ?3.3 12.1 ?6.0 100 Wrist - ADM 7   39.7     B.Elbow ADM 5.7  11.2  92 B.Elbow - Wrist 16 56 ?49 38.7     A.Elbow ADM 7.9  10.7  96.2 A.Elbow - B.Elbow 11 49  ?49 38.5         A.Elbow - Wrist      R Ulnar - ADM     Wrist ADM 2.4 ?3.3 10.3 ?6.0 100 Wrist - ADM 7   32.4     B.Elbow ADM 5.7  10.0  97.2 B.Elbow - Wrist 17 51 ?49 31.1     A.Elbow ADM 8.2  9.0  89.3 A.Elbow - B.Elbow 12 49 ?49 28.5         A.Elbow - Wrist                 SNC    Nerve / Sites Rec. Site Peak Lat Ref.  Amp Ref. Segments Distance    ms ms V V  cm  L Median - Orthodromic (Dig II, Mid palm)     Dig II Wrist 2.97 ?3.40 17 ?10 Dig II - Wrist 13  R Median - Orthodromic (Dig II, Mid palm)     Dig II Wrist 3.70 ?3.40 9 ?10 Dig II - Wrist 13  L Ulnar - Orthodromic, (Dig V, Mid palm)     Dig V Wrist 2.60 ?3.10 9 ?5 Dig V - Wrist 11  R Ulnar - Orthodromic, (Dig V, Mid palm)     Dig V Wrist 2.40 ?  3.10 5 ?5 Dig V - Wrist 22             F  Wave    Nerve F Lat Ref.   ms ms  L Ulnar - ADM 26.7 ?32.0  R Ulnar - ADM 26.5 ?32.0         EMG full       EMG Summary Table    Spontaneous MUAP Recruitment  Muscle IA Fib PSW Fasc Other Amp Dur. Poly Pattern  R. Abductor pollicis brevis Normal None None None _______ Normal Normal Normal Normal  R. Pronator teres Normal None None None _______ Normal Normal Normal Normal  R. Deltoid Normal None None None _______ Normal Normal Normal Normal  R. Biceps brachii Normal None None None _______ Normal Normal Normal Normal  R. Extensor digitorum communis Normal None None None _______ Normal Normal Normal Normal  L. Abductor pollicis brevis Normal None None None _______ Normal Normal Normal Normal  R. First dorsal interosseous Normal None None None _______ Normal Normal Normal Normal  L. First dorsal interosseous Normal None None None _______ Normal Normal Normal Normal  L. Pronator teres Normal None None None _______ Normal Normal Normal Normal  L. Biceps brachii Normal None None None _______ Normal Normal Normal Normal  L. Deltoid Normal None None None _______ Normal Normal Normal Normal  L. Triceps brachii Normal None None None _______  Normal Normal Normal Normal  R. Cervical paraspinals Normal None None None _______ Normal Normal Normal Normal  L. Cervical paraspinals Normal None None None _______ Normal Normal Normal Normal

## 2017-04-01 ENCOUNTER — Other Ambulatory Visit: Payer: BLUE CROSS/BLUE SHIELD

## 2017-05-08 DIAGNOSIS — Z170 Estrogen receptor positive status [ER+]: Secondary | ICD-10-CM | POA: Diagnosis not present

## 2017-05-08 DIAGNOSIS — D0512 Intraductal carcinoma in situ of left breast: Secondary | ICD-10-CM | POA: Diagnosis not present

## 2017-05-08 DIAGNOSIS — D0510 Intraductal carcinoma in situ of unspecified breast: Secondary | ICD-10-CM | POA: Diagnosis not present

## 2017-06-24 DIAGNOSIS — R928 Other abnormal and inconclusive findings on diagnostic imaging of breast: Secondary | ICD-10-CM | POA: Diagnosis not present

## 2017-06-24 DIAGNOSIS — D0512 Intraductal carcinoma in situ of left breast: Secondary | ICD-10-CM | POA: Diagnosis not present

## 2017-06-25 DIAGNOSIS — M545 Low back pain: Secondary | ICD-10-CM | POA: Diagnosis not present

## 2017-09-21 DIAGNOSIS — J069 Acute upper respiratory infection, unspecified: Secondary | ICD-10-CM | POA: Diagnosis not present

## 2017-10-05 DIAGNOSIS — K529 Noninfective gastroenteritis and colitis, unspecified: Secondary | ICD-10-CM | POA: Diagnosis not present

## 2017-10-10 ENCOUNTER — Other Ambulatory Visit: Payer: Self-pay

## 2017-10-10 ENCOUNTER — Ambulatory Visit (INDEPENDENT_AMBULATORY_CARE_PROVIDER_SITE_OTHER): Payer: BLUE CROSS/BLUE SHIELD

## 2017-10-10 ENCOUNTER — Encounter (HOSPITAL_COMMUNITY): Payer: Self-pay

## 2017-10-10 ENCOUNTER — Ambulatory Visit (HOSPITAL_COMMUNITY)
Admission: EM | Admit: 2017-10-10 | Discharge: 2017-10-10 | Disposition: A | Discharge status: Home / Self Care | Payer: BLUE CROSS/BLUE SHIELD | Attending: Family Medicine | Admitting: Family Medicine

## 2017-10-10 DIAGNOSIS — K5901 Slow transit constipation: Secondary | ICD-10-CM

## 2017-10-10 DIAGNOSIS — R1030 Lower abdominal pain, unspecified: Secondary | ICD-10-CM

## 2017-10-10 LAB — POCT URINALYSIS DIP (DEVICE)
Glucose, UA: NEGATIVE mg/dL
Ketones, ur: NEGATIVE mg/dL
LEUKOCYTES UA: NEGATIVE
NITRITE: NEGATIVE
PROTEIN: 30 mg/dL — AB
SPECIFIC GRAVITY, URINE: 1.025 (ref 1.005–1.030)
Urobilinogen, UA: >=8 mg/dL (ref 0.0–1.0)
pH: 6 (ref 5.0–8.0)

## 2017-10-10 MED ORDER — POLYETHYLENE GLYCOL 3350 17 GM/SCOOP PO POWD: 1.0000 | Freq: Once | ORAL | 0 refills | Status: AC

## 2017-10-10 MED ORDER — HYDROCODONE-ACETAMINOPHEN 5-325 MG PO TABS: 1.0000 | ORAL_TABLET | Freq: Four times a day (QID) | ORAL | 0 refills | Status: DC | PRN

## 2017-10-10 NOTE — ED Provider Notes (Signed)
Jasmine Meadows   [id number] 10/10/17 Arrival Time: 1205   SUBJECTIVE:  Jasmine Meadows is a 51 y.o. female who presents to the urgent care with complaint of lower abdominal pain that has been going on for a week. States that the pain is so bad. When she urinates or tries to have a BM the pain gets really intense and there is a lot of pressure. States she has a hx of fibroids and this does feel similar to that pain. States she has also had some urinary frequency more than usual and her lower back is hurting as well.      Past Medical History:  Diagnosis Date  . Bilateral hand pain   . Breast cancer ([place])    left - radiation only  . Carpal tunnel syndrome of left wrist   . Carpal tunnel syndrome of right wrist 04/2012  . Environmental allergies   . PONV (postoperative nausea and vomiting)   . Seasonal allergies   . Uterine fibroid    Lupron injection every 3 mos.   Family History  Problem Relation Age of Onset  . Asthma Other   . Hypertension Other   . Transient ischemic attack Mother   . Hypertension Mother   . Asthma Mother   . Dementia Father    Social History   Socioeconomic History  . Marital status: Married    Spouse name: Not on file  . Number of children: 1  . Years of education: HS  . Highest education level: Not on file  Social Needs  . Financial resource strain: Not on file  . Food insecurity - worry: Not on file  . Food insecurity - inability: Not on file  . Transportation needs - medical: Not on file  . Transportation needs - non-medical: Not on file  Occupational History  . 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 Smoking status: Never Smoker  . Smokeless tobacco: Never Used  Substance and Sexual Activity  . Alcohol use: No  . Drug use: No  . Sexual activity: Not on file  Other Topics Concern  . Not on file  Social History Narrative   Lives at home with husband.   Right-handed.   No caffeine use.   Current Meds    Medication Sig  . aspirin EC 81 MG tablet Take 81 mg by mouth daily.  . calcium carbonate (OS-CAL) 600 MG TABS Take 600 mg by mouth 2 (two) times daily with a meal.  . cetirizine (ZYRTEC) 5 MG tablet Take 5 mg by mouth daily.  . Multiple Vitamin (MULTI-VITAMINS) TABS Take 1 tablet by mouth daily.  . tamoxifen (NOLVADEX) 10 MG tablet Take 10 mg by mouth daily.   Allergies  Allergen Reactions  . Other Anaphylaxis      ROS: As per HPI, remainder of ROS negative.   OBJECTIVE:   Vitals:   10/10/17 1241  BP: 121/79  Pulse: (!) 102  Resp: 16  Temp: 98.2 F (36.8 C)  TempSrc: Oral  SpO2: 100%     General appearance: alert; no distress Eyes: PERRL; EOMI; conjunctiva normal HENT: normocephalic; atraumatic;  Neck: supple Lungs: clear to auscultation bilaterally Heart: regular rate and rhythm Abdomen: soft, tender and firm lower abdomen diffusely; bowel sounds normal; no masses or organomegaly; some guarding but no rebound tenderness Back: no CVA tenderness Extremities: no cyanosis or edema; symmetrical with no gross deformities Skin: warm and dry Neurologic: normal gait; grossly normal Psychological: alert and cooperative; normal  mood and affect    Labs:  Results for orders placed or performed during the hospital encounter of 10/10/17  POCT urinalysis dip (device)  Result Value Ref Range   Glucose, UA NEGATIVE NEGATIVE mg/dL   Bilirubin Urine SMALL (A) NEGATIVE   Ketones, ur NEGATIVE NEGATIVE mg/dL   Specific Gravity, Urine 1.025 1.005 - 1.030   Hgb urine dipstick SMALL (A) NEGATIVE   pH 6.0 5.0 - 8.0   Protein, ur 30 (A) NEGATIVE mg/dL   Urobilinogen, UA >=8.0 0.0 - 1.0 mg/dL   Nitrite NEGATIVE NEGATIVE   Leukocytes, UA NEGATIVE NEGATIVE    Labs Reviewed  POCT URINALYSIS DIP (DEVICE) - Abnormal; Notable for the following components:      Result Value   Bilirubin Urine SMALL (*)    Hgb urine dipstick SMALL (*)    Protein, ur 30 (*)    All other components  within normal limits    Dg Abd Acute W/chest  Result Date: 10/10/2017 CLINICAL DATA:  Per pt: lower abdominal pain for over a week and a half, no fever, no constipation, no N/V/D, painful to have a BM. Lower abdominal pain transversely. History of three surgeries to remove fibroids in uterus, could not remove all of them. Non-smoker. No history of respiratory or cardiac disease. History of Breast cancer, three years ago, left breast. Patient is not a diabetic. EXAM: DG ABDOMEN ACUTE W/ 1V CHEST COMPARISON:  None. FINDINGS: There is no bowel dilation to suggest obstruction or adynamic ileus. No air-fluid levels. Mild increased stool is noted throughout the colon. No free air. No evidence of renal or ureteral stones. Soft tissues are unremarkable. Heart, mediastinum and hila are within normal limits. Lungs are clear. No acute skeletal abnormalities. IMPRESSION: 1. No acute findings.  No evidence of bowel obstruction or free air. 2. Mild increased stool throughout the colon. 3. No active cardiopulmonary disease. Electronically Signed   By: Lajean Manes M.D.   On: 10/10/2017 13:48       ASSESSMENT & PLAN:  1. Slow transit constipation     Meds ordered this encounter  Medications  . HYDROcodone-acetaminophen (NORCO) 5-325 MG tablet    Sig: Take 1 tablet by mouth every 6 (six) hours as needed for moderate pain.    Dispense:  12 tablet    Refill:  0  . polyethylene glycol powder (MIRALAX) powder    Sig: Take 255 g by mouth once for 1 dose.    Dispense:  255 g    Refill:  0    Reviewed expectations re: course of current medical issues. Questions answered. Outlined signs and symptoms indicating need for more acute intervention. Patient verbalized understanding. After Visit Summary given.    Procedures:      Robyn Haber, MD 10/10/17 1406

## 2017-10-10 NOTE — ED Triage Notes (Addendum)
Pt presents today with lower abdominal pain that has been going on for a week. States that the pain is so bad. When she urinates or tries to have a BM the pain gets really intense and there is a lot of pressure. States she has a hx of fibroids and this does feel similar to that pain. States she has also had some urinary frequency more than usual and her lower back is hurting as well.

## 2017-10-10 NOTE — Discharge Instructions (Signed)
Take the miralax at least three times today  Colace twice a day  If pain is worsening, follow up at [facility] or call Dr. Garwin Brothers for an ultrasound exam

## 2017-10-16 DIAGNOSIS — R109 Unspecified abdominal pain: Secondary | ICD-10-CM | POA: Diagnosis not present

## 2017-10-16 DIAGNOSIS — D259 Leiomyoma of uterus, unspecified: Secondary | ICD-10-CM | POA: Diagnosis not present

## 2017-10-26 DIAGNOSIS — Z131 Encounter for screening for diabetes mellitus: Secondary | ICD-10-CM | POA: Diagnosis not present

## 2017-10-26 DIAGNOSIS — Z13220 Encounter for screening for lipoid disorders: Secondary | ICD-10-CM | POA: Diagnosis not present

## 2017-10-26 DIAGNOSIS — Z0000 Encounter for general adult medical examination without abnormal findings: Secondary | ICD-10-CM | POA: Diagnosis not present

## 2017-10-26 DIAGNOSIS — R109 Unspecified abdominal pain: Secondary | ICD-10-CM | POA: Diagnosis not present

## 2017-10-26 DIAGNOSIS — D259 Leiomyoma of uterus, unspecified: Secondary | ICD-10-CM | POA: Diagnosis not present

## 2017-11-17 DIAGNOSIS — Z6834 Body mass index (BMI) 34.0-34.9, adult: Secondary | ICD-10-CM | POA: Diagnosis not present

## 2017-11-17 DIAGNOSIS — Z01419 Encounter for gynecological examination (general) (routine) without abnormal findings: Secondary | ICD-10-CM | POA: Diagnosis not present

## 2017-11-17 DIAGNOSIS — Z1151 Encounter for screening for human papillomavirus (HPV): Secondary | ICD-10-CM | POA: Diagnosis not present

## 2017-11-19 DIAGNOSIS — D0512 Intraductal carcinoma in situ of left breast: Secondary | ICD-10-CM | POA: Diagnosis not present

## 2017-11-19 DIAGNOSIS — Z6835 Body mass index (BMI) 35.0-35.9, adult: Secondary | ICD-10-CM | POA: Diagnosis not present

## 2017-11-19 DIAGNOSIS — Z923 Personal history of irradiation: Secondary | ICD-10-CM | POA: Diagnosis not present

## 2017-11-19 DIAGNOSIS — Z170 Estrogen receptor positive status [ER+]: Secondary | ICD-10-CM | POA: Diagnosis not present

## 2017-11-19 DIAGNOSIS — Z79810 Long term (current) use of selective estrogen receptor modulators (SERMs): Secondary | ICD-10-CM | POA: Diagnosis not present

## 2017-11-19 DIAGNOSIS — R635 Abnormal weight gain: Secondary | ICD-10-CM | POA: Diagnosis not present

## 2017-11-27 DIAGNOSIS — Z170 Estrogen receptor positive status [ER+]: Secondary | ICD-10-CM | POA: Diagnosis not present

## 2017-11-27 DIAGNOSIS — Z79810 Long term (current) use of selective estrogen receptor modulators (SERMs): Secondary | ICD-10-CM | POA: Diagnosis not present

## 2017-11-27 DIAGNOSIS — D0510 Intraductal carcinoma in situ of unspecified breast: Secondary | ICD-10-CM | POA: Diagnosis not present

## 2017-11-27 DIAGNOSIS — D0512 Intraductal carcinoma in situ of left breast: Secondary | ICD-10-CM | POA: Diagnosis not present

## 2017-11-27 DIAGNOSIS — Z923 Personal history of irradiation: Secondary | ICD-10-CM | POA: Diagnosis not present

## 2017-11-27 DIAGNOSIS — R921 Mammographic calcification found on diagnostic imaging of breast: Secondary | ICD-10-CM | POA: Diagnosis not present

## 2017-12-07 DIAGNOSIS — R928 Other abnormal and inconclusive findings on diagnostic imaging of breast: Secondary | ICD-10-CM | POA: Diagnosis not present

## 2017-12-07 DIAGNOSIS — N6489 Other specified disorders of breast: Secondary | ICD-10-CM | POA: Diagnosis not present

## 2017-12-07 DIAGNOSIS — N641 Fat necrosis of breast: Secondary | ICD-10-CM | POA: Diagnosis not present

## 2017-12-07 DIAGNOSIS — R921 Mammographic calcification found on diagnostic imaging of breast: Secondary | ICD-10-CM | POA: Diagnosis not present

## 2017-12-18 DIAGNOSIS — D0512 Intraductal carcinoma in situ of left breast: Secondary | ICD-10-CM | POA: Diagnosis not present

## 2018-05-13 DIAGNOSIS — Z9889 Other specified postprocedural states: Secondary | ICD-10-CM | POA: Diagnosis not present

## 2018-05-13 DIAGNOSIS — D0512 Intraductal carcinoma in situ of left breast: Secondary | ICD-10-CM | POA: Diagnosis not present

## 2018-05-13 DIAGNOSIS — Z7982 Long term (current) use of aspirin: Secondary | ICD-10-CM | POA: Diagnosis not present

## 2018-05-13 DIAGNOSIS — N951 Menopausal and female climacteric states: Secondary | ICD-10-CM | POA: Diagnosis not present

## 2018-05-13 DIAGNOSIS — Z79899 Other long term (current) drug therapy: Secondary | ICD-10-CM | POA: Diagnosis not present

## 2018-05-13 DIAGNOSIS — Z79810 Long term (current) use of selective estrogen receptor modulators (SERMs): Secondary | ICD-10-CM | POA: Diagnosis not present

## 2018-05-13 DIAGNOSIS — R635 Abnormal weight gain: Secondary | ICD-10-CM | POA: Diagnosis not present

## 2018-05-13 DIAGNOSIS — Z6836 Body mass index (BMI) 36.0-36.9, adult: Secondary | ICD-10-CM | POA: Diagnosis not present

## 2018-11-18 DIAGNOSIS — Z79810 Long term (current) use of selective estrogen receptor modulators (SERMs): Secondary | ICD-10-CM | POA: Diagnosis not present

## 2018-11-18 DIAGNOSIS — D0512 Intraductal carcinoma in situ of left breast: Secondary | ICD-10-CM | POA: Diagnosis not present

## 2018-12-23 IMAGING — DX DG ABDOMEN ACUTE W/ 1V CHEST
3 series · 3 of 3 positions shown · non-contrast
Comparison: None.

CLINICAL DATA: Per pt: lower abdominal pain for over a week and a
half, no fever, no constipation, no N/V/D, painful to have a BM.
Lower abdominal pain transversely. History of three surgeries to
remove fibroids in uterus, could not remove all of them. Non-smoker.
No history of respiratory or cardiac disease. History of Breast

EXAM:
DG ABDOMEN ACUTE W/ 1V CHEST

[chest pa]
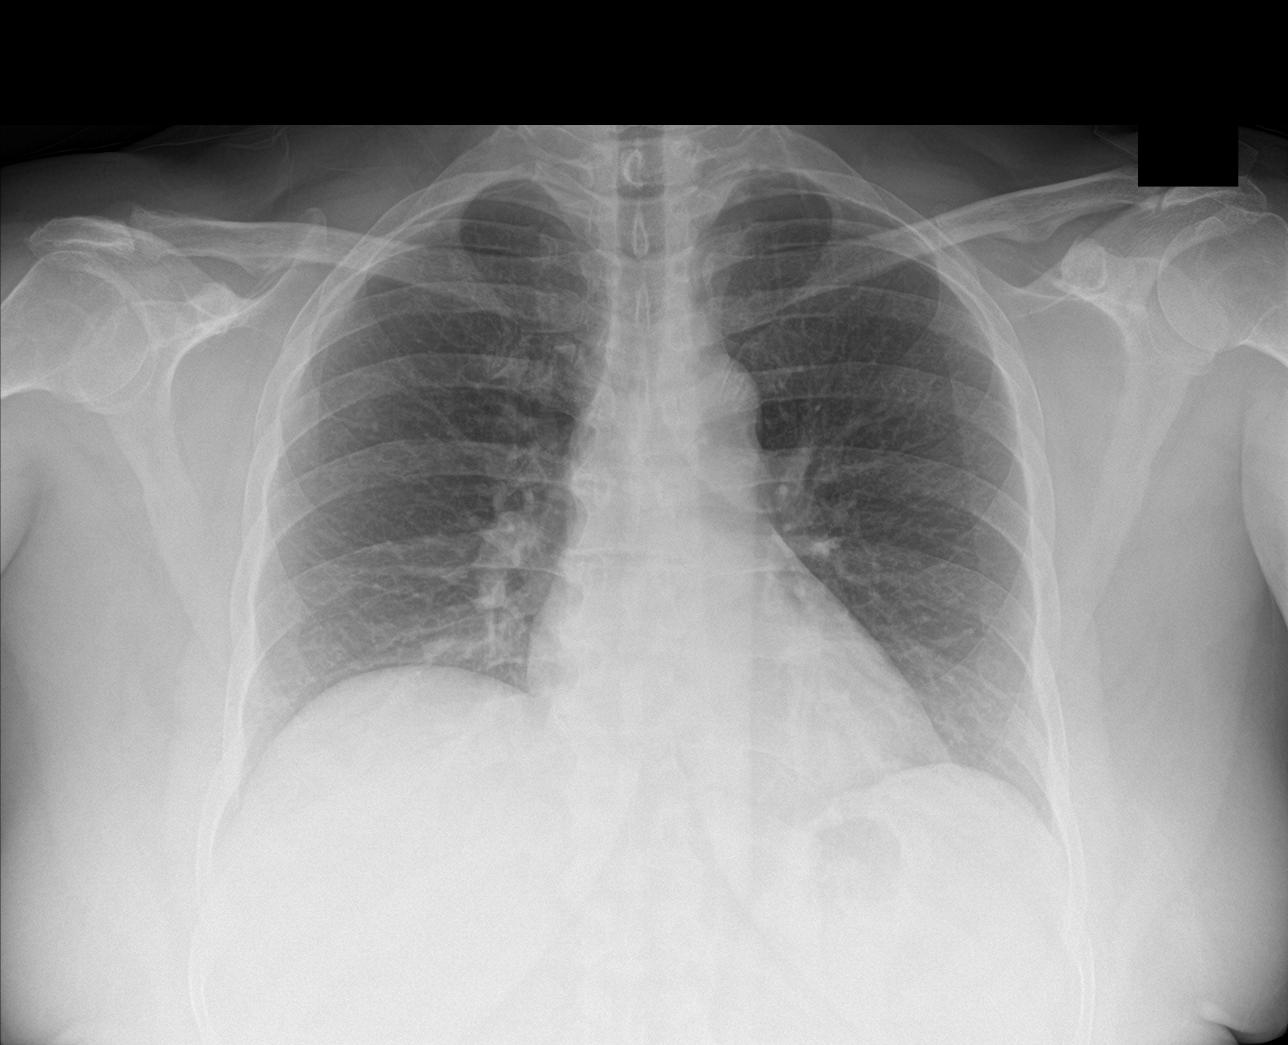

[abdomen erect]
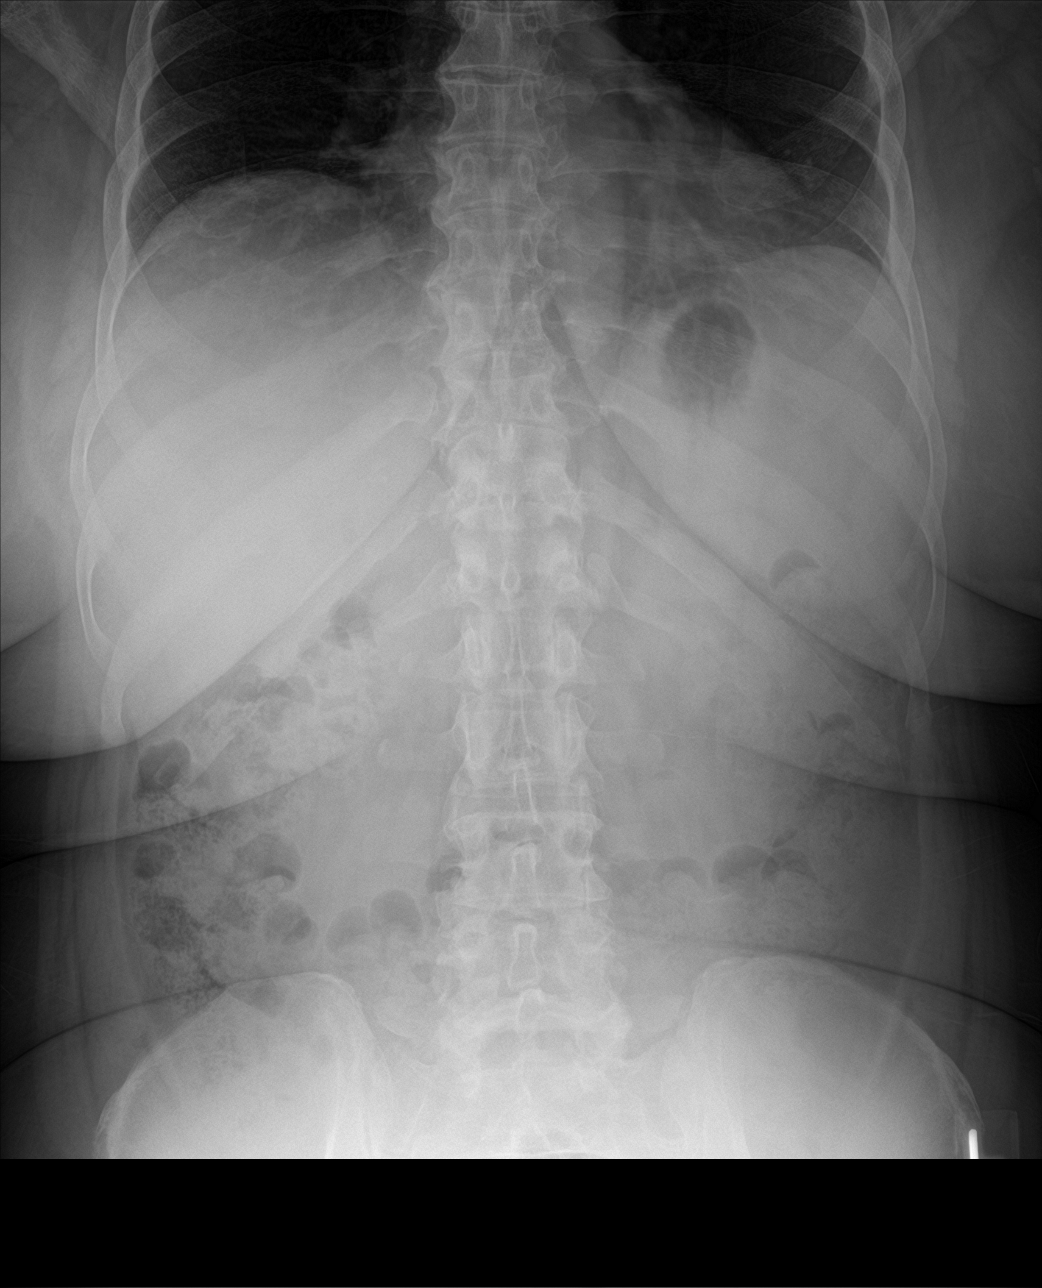

[abdomen supine]
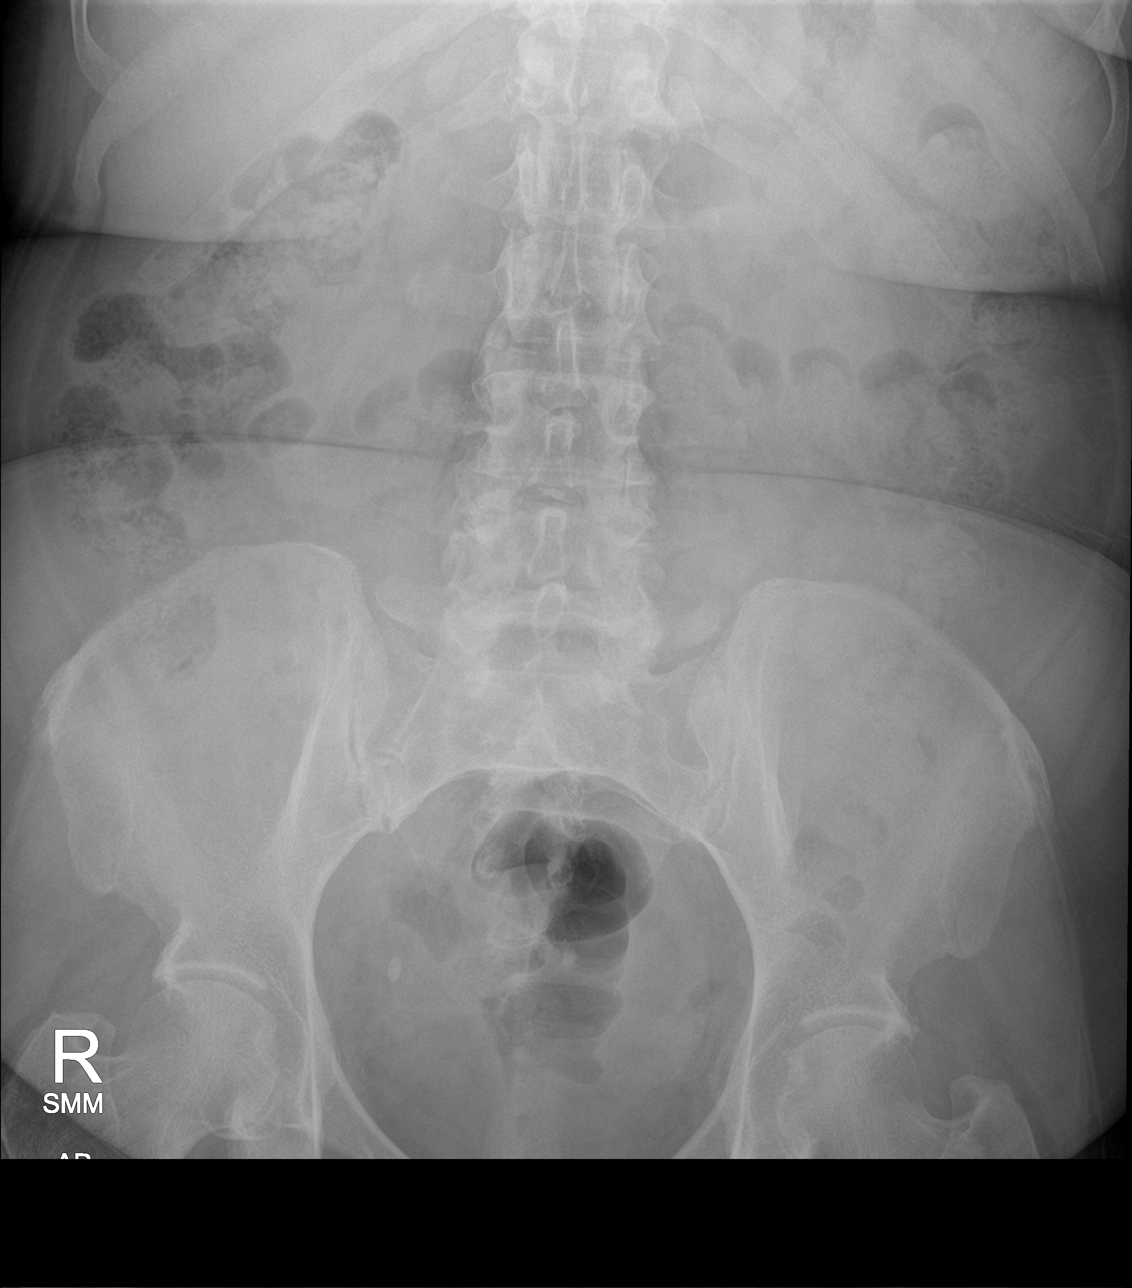

[3 of 3 positions shown; findings below may reference images not displayed]

FINDINGS: There is no bowel dilation to suggest obstruction or adynamic ileus.
No air-fluid levels. Mild increased stool is noted throughout the
colon.

No free air.

No evidence of renal or ureteral stones. Soft tissues are
unremarkable.

Heart, mediastinum and hila are within normal limits. Lungs are
clear.

No acute skeletal abnormalities.
IMPRESSION: 1. No acute findings.  No evidence of bowel obstruction or free air.
2. Mild increased stool throughout the colon.
3. No active cardiopulmonary disease.

## 2019-01-04 DIAGNOSIS — Z1151 Encounter for screening for human papillomavirus (HPV): Secondary | ICD-10-CM | POA: Diagnosis not present

## 2019-01-04 DIAGNOSIS — Z118 Encounter for screening for other infectious and parasitic diseases: Secondary | ICD-10-CM | POA: Diagnosis not present

## 2019-01-04 DIAGNOSIS — Z01419 Encounter for gynecological examination (general) (routine) without abnormal findings: Secondary | ICD-10-CM | POA: Diagnosis not present

## 2019-01-04 DIAGNOSIS — Z6836 Body mass index (BMI) 36.0-36.9, adult: Secondary | ICD-10-CM | POA: Diagnosis not present

## 2019-01-05 DIAGNOSIS — Z170 Estrogen receptor positive status [ER+]: Secondary | ICD-10-CM | POA: Diagnosis not present

## 2019-01-05 DIAGNOSIS — Z79810 Long term (current) use of selective estrogen receptor modulators (SERMs): Secondary | ICD-10-CM | POA: Diagnosis not present

## 2019-01-05 DIAGNOSIS — Z853 Personal history of malignant neoplasm of breast: Secondary | ICD-10-CM | POA: Diagnosis not present

## 2019-01-05 DIAGNOSIS — D0512 Intraductal carcinoma in situ of left breast: Secondary | ICD-10-CM | POA: Diagnosis not present

## 2019-01-11 ENCOUNTER — Encounter (INDEPENDENT_AMBULATORY_CARE_PROVIDER_SITE_OTHER): Payer: Self-pay

## 2019-01-12 ENCOUNTER — Encounter (INDEPENDENT_AMBULATORY_CARE_PROVIDER_SITE_OTHER): Payer: Self-pay

## 2019-01-12 DIAGNOSIS — R87618 Other abnormal cytological findings on specimens from cervix uteri: Secondary | ICD-10-CM | POA: Diagnosis not present

## 2019-01-12 DIAGNOSIS — D252 Subserosal leiomyoma of uterus: Secondary | ICD-10-CM | POA: Diagnosis not present

## 2019-01-12 DIAGNOSIS — N858 Other specified noninflammatory disorders of uterus: Secondary | ICD-10-CM | POA: Diagnosis not present

## 2019-01-12 DIAGNOSIS — D251 Intramural leiomyoma of uterus: Secondary | ICD-10-CM | POA: Diagnosis not present

## 2019-01-17 ENCOUNTER — Ambulatory Visit (INDEPENDENT_AMBULATORY_CARE_PROVIDER_SITE_OTHER): Payer: Self-pay | Admitting: Family Medicine

## 2019-01-31 ENCOUNTER — Ambulatory Visit (INDEPENDENT_AMBULATORY_CARE_PROVIDER_SITE_OTHER): Payer: Self-pay | Admitting: Family Medicine

## 2019-05-02 DIAGNOSIS — M545 Low back pain: Secondary | ICD-10-CM | POA: Diagnosis not present

## 2019-05-19 DIAGNOSIS — D0512 Intraductal carcinoma in situ of left breast: Secondary | ICD-10-CM | POA: Diagnosis not present

## 2019-07-28 DIAGNOSIS — Z08 Encounter for follow-up examination after completed treatment for malignant neoplasm: Secondary | ICD-10-CM | POA: Diagnosis not present

## 2019-07-28 DIAGNOSIS — D0512 Intraductal carcinoma in situ of left breast: Secondary | ICD-10-CM | POA: Diagnosis not present

## 2019-07-28 DIAGNOSIS — N6002 Solitary cyst of left breast: Secondary | ICD-10-CM | POA: Diagnosis not present

## 2019-07-28 DIAGNOSIS — Z79810 Long term (current) use of selective estrogen receptor modulators (SERMs): Secondary | ICD-10-CM | POA: Diagnosis not present

## 2019-07-28 DIAGNOSIS — Z853 Personal history of malignant neoplasm of breast: Secondary | ICD-10-CM | POA: Diagnosis not present

## 2019-11-24 DIAGNOSIS — Z9889 Other specified postprocedural states: Secondary | ICD-10-CM | POA: Diagnosis not present

## 2019-11-24 DIAGNOSIS — Z79899 Other long term (current) drug therapy: Secondary | ICD-10-CM | POA: Diagnosis not present

## 2019-11-24 DIAGNOSIS — D0512 Intraductal carcinoma in situ of left breast: Secondary | ICD-10-CM | POA: Diagnosis not present

## 2019-11-24 DIAGNOSIS — Z79810 Long term (current) use of selective estrogen receptor modulators (SERMs): Secondary | ICD-10-CM | POA: Diagnosis not present

## 2019-11-24 DIAGNOSIS — N6082 Other benign mammary dysplasias of left breast: Secondary | ICD-10-CM | POA: Diagnosis not present

## 2019-11-24 DIAGNOSIS — Z923 Personal history of irradiation: Secondary | ICD-10-CM | POA: Diagnosis not present

## 2020-02-16 DIAGNOSIS — Z08 Encounter for follow-up examination after completed treatment for malignant neoplasm: Secondary | ICD-10-CM | POA: Diagnosis not present

## 2020-02-16 DIAGNOSIS — Z923 Personal history of irradiation: Secondary | ICD-10-CM | POA: Diagnosis not present

## 2020-02-16 DIAGNOSIS — Z853 Personal history of malignant neoplasm of breast: Secondary | ICD-10-CM | POA: Diagnosis not present

## 2020-02-16 DIAGNOSIS — D0512 Intraductal carcinoma in situ of left breast: Secondary | ICD-10-CM | POA: Diagnosis not present

## 2020-02-16 DIAGNOSIS — Z79810 Long term (current) use of selective estrogen receptor modulators (SERMs): Secondary | ICD-10-CM | POA: Diagnosis not present

## 2020-03-27 ENCOUNTER — Encounter (HOSPITAL_COMMUNITY): Payer: Self-pay

## 2020-03-27 ENCOUNTER — Other Ambulatory Visit: Payer: Self-pay

## 2020-03-27 ENCOUNTER — Emergency Department (HOSPITAL_COMMUNITY)
Admission: EM | Admit: 2020-03-27 | Discharge: 2020-03-27 | Disposition: A | Discharge status: Home / Self Care | Payer: BC Managed Care – PPO | Attending: Emergency Medicine | Admitting: Emergency Medicine

## 2020-03-27 ENCOUNTER — Emergency Department (HOSPITAL_COMMUNITY): Payer: BC Managed Care – PPO

## 2020-03-27 DIAGNOSIS — Z7982 Long term (current) use of aspirin: Secondary | ICD-10-CM | POA: Diagnosis not present

## 2020-03-27 DIAGNOSIS — M25512 Pain in left shoulder: Secondary | ICD-10-CM | POA: Insufficient documentation

## 2020-03-27 DIAGNOSIS — R0789 Other chest pain: Secondary | ICD-10-CM | POA: Diagnosis not present

## 2020-03-27 DIAGNOSIS — K30 Functional dyspepsia: Secondary | ICD-10-CM | POA: Insufficient documentation

## 2020-03-27 DIAGNOSIS — Z79899 Other long term (current) drug therapy: Secondary | ICD-10-CM | POA: Diagnosis not present

## 2020-03-27 DIAGNOSIS — Z853 Personal history of malignant neoplasm of breast: Secondary | ICD-10-CM | POA: Insufficient documentation

## 2020-03-27 DIAGNOSIS — R079 Chest pain, unspecified: Secondary | ICD-10-CM | POA: Diagnosis not present

## 2020-03-27 DIAGNOSIS — M79662 Pain in left lower leg: Secondary | ICD-10-CM | POA: Diagnosis not present

## 2020-03-27 DIAGNOSIS — J984 Other disorders of lung: Secondary | ICD-10-CM | POA: Diagnosis not present

## 2020-03-27 LAB — D-DIMER, QUANTITATIVE: D-Dimer, Quant: 0.3 ug/mL-FEU (ref 0.00–0.50)

## 2020-03-27 LAB — HEPATIC FUNCTION PANEL
ALT: 18 U/L (ref 0–44)
AST: 20 U/L (ref 15–41)
Albumin: 3.5 g/dL (ref 3.5–5.0)
Alkaline Phosphatase: 65 U/L (ref 38–126)
Bilirubin, Direct: <0.1 mg/dL (ref 0.0–0.2)
Total Bilirubin: 0.6 mg/dL (ref 0.3–1.2)
Total Protein: 7.1 g/dL (ref 6.5–8.1)

## 2020-03-27 LAB — CBC
HCT: 40.4 % (ref 36.0–46.0)
Hemoglobin: 13 g/dL (ref 12.0–15.0)
MCH: 28.4 pg (ref 26.0–34.0)
MCHC: 32.2 g/dL (ref 30.0–36.0)
MCV: 88.4 fL (ref 80.0–100.0)
Platelets: 239 10*3/uL (ref 150–400)
RBC: 4.57 MIL/uL (ref 3.87–5.11)
RDW: 13 % (ref 11.5–15.5)
WBC: 6.4 10*3/uL (ref 4.0–10.5)
nRBC: 0 % (ref 0.0–0.2)

## 2020-03-27 LAB — BASIC METABOLIC PANEL
Anion gap: 9 (ref 5–15)
BUN: 11 mg/dL (ref 6–20)
CO2: 25 mmol/L (ref 22–32)
Calcium: 9.3 mg/dL (ref 8.9–10.3)
Chloride: 105 mmol/L (ref 98–111)
Creatinine, Ser: 0.87 mg/dL (ref 0.44–1.00)
GFR calc Af Amer: >60 mL/min (ref >60)
GFR calc non Af Amer: >60 mL/min (ref >60)
Glucose, Bld: 123 mg/dL — ABNORMAL HIGH (ref 70–99)
Potassium: 4 mmol/L (ref 3.5–5.1)
Sodium: 139 mmol/L (ref 135–145)

## 2020-03-27 LAB — TROPONIN I (HIGH SENSITIVITY)
Troponin I (High Sensitivity): <2 ng/L (ref <18)
Troponin I (High Sensitivity): 4 ng/L (ref <18)

## 2020-03-27 LAB — I-STAT BETA HCG BLOOD, ED (MC, WL, AP ONLY): I-stat hCG, quantitative: <5 m[IU]/mL (ref <5)

## 2020-03-27 MED ORDER — ALUM & MAG HYDROXIDE-SIMETH 200-200-20 MG/5ML PO SUSP
30.0000 mL | Freq: Once | ORAL | Status: AC
  Administered 2020-03-27: 30 mL via ORAL
  Filled 2020-03-27: qty 30

## 2020-03-27 MED ORDER — FAMOTIDINE 20 MG PO TABS
20.0000 mg | ORAL_TABLET | Freq: Every day | ORAL | 0 refills | Status: DC
Start: 2020-03-27 — End: 2020-11-08

## 2020-03-27 MED ORDER — LIDOCAINE VISCOUS HCL 2 % MT SOLN
15.0000 mL | Freq: Once | OROMUCOSAL | Status: AC
  Administered 2020-03-27: 15 mL via ORAL
  Filled 2020-03-27: qty 15

## 2020-03-27 MED ORDER — SODIUM CHLORIDE 0.9% FLUSH: 3.0000 mL | Freq: Once | INTRAVENOUS | Status: DC

## 2020-03-27 NOTE — ED Provider Notes (Signed)
[facility] EMERGENCY DEPARTMENT Provider Note   CSN: [id number] Arrival date & time: 03/27/20  7948     History Chief Complaint  Patient presents with   Chest Pain    Jasmine Meadows is a 54 y.o. female.  Pt presents to the ED with cp and indigestion.  The pt said it's been going on for a few days.  She has a little discomfort in her left shoulder.  She also had some pain in the left calf.  No swelling.  She is on Tamoxifen for breast cancer and is worried about a blood clot.  No sob.  No f/c.          Past Medical History:  Diagnosis Date   Bilateral hand pain    Breast cancer ([place])    left - radiation only   Carpal tunnel syndrome of left wrist    Carpal tunnel syndrome of right wrist 04/2012   Environmental allergies    PONV (postoperative nausea and vomiting)    Seasonal allergies    Uterine fibroid    Lupron injection every 3 mos.    Patient Active Problem List   Diagnosis Date Noted   CTS (carpal tunnel syndrome) 02/13/2017   Paresthesia 01/12/2017   Weakness 01/12/2017   Hyperreflexia 01/12/2017    Past Surgical History:  Procedure Laterality Date   BREAST REDUCTION SURGERY     CARPAL TUNNEL RELEASE  03/19/2012   Procedure: CARPAL TUNNEL RELEASE;  Surgeon: Wynonia Sours, MD;  Location: [place];  Service: Orthopedics;  Laterality: Left;   CARPAL TUNNEL RELEASE  05/07/2012   Procedure: CARPAL TUNNEL RELEASE;  Surgeon: Wynonia Sours, MD;  Location: [place];  Service: Orthopedics;  Laterality: Right;   MASS EXCISION Left 03/31/2014   Procedure: EXCISION CYST ;  Surgeon: Wynonia Sours, MD;  Location: [place];  Service: Orthopedics;  Laterality: Left;  ANESTHESIA:  IV REGIONAL FAB   MYOMECTOMY     TRIGGER FINGER RELEASE Left 03/31/2014   Procedure: RELEASE A-1 PULLEY LEFT RING FINGER;  Surgeon: Wynonia Sours, MD;  Location: [place];  Service: Orthopedics;   Laterality: Left;     OB History   No obstetric history on file.     Family History  Problem Relation Age of Onset   Asthma Other    Hypertension Other    Transient ischemic attack Mother    Hypertension Mother    Asthma Mother    Dementia Father     Social History   Tobacco Use   Smoking status: Never Smoker   Smokeless tobacco: Never Used  Substance Use Topics   Alcohol use: No   Drug use: No    Home Medications Prior to Admission medications   Medication Sig Start Date End Date Taking? Authorizing Provider  aspirin EC 81 MG tablet Take 81 mg by mouth daily. 12/05/15   [provider]  calcium carbonate (OS-CAL) 600 MG TABS Take 600 mg by mouth 2 (two) times daily with a meal.    [provider]  cetirizine (ZYRTEC) 5 MG tablet Take 5 mg by mouth daily.    [provider]  famotidine (PEPCID) 20 MG tablet Take 1 tablet (20 mg total) by mouth daily. 03/27/20   Isla Pence, MD  HYDROcodone-acetaminophen (NORCO) 5-325 MG tablet Take 1 tablet by mouth every 6 (six) hours as needed for moderate pain. 10/10/17   Robyn Haber, MD  Multiple  Vitamin (MULTI-VITAMINS) TABS Take 1 tablet by mouth daily.    [provider]  tamoxifen (NOLVADEX) 10 MG tablet Take 10 mg by mouth daily.    [provider]    Allergies    Other  Review of Systems   Review of Systems  Cardiovascular: Positive for chest pain.  All other systems reviewed and are negative.   Physical Exam Updated Vital Signs BP 114/75    Pulse 81    Temp 98.9 F (37.2 C) (Oral)    Resp 19    LMP 02/02/2012    SpO2 99%   Physical Exam Vitals and nursing note reviewed.  Constitutional:      Appearance: She is well-developed.  HENT:     Head: Normocephalic and atraumatic.  Eyes:     Extraocular Movements: Extraocular movements intact.     Pupils: Pupils are equal, round, and reactive to light.  Cardiovascular:     Rate and Rhythm: Normal rate and  regular rhythm.     Heart sounds: Normal heart sounds.  Pulmonary:     Effort: Pulmonary effort is normal.     Breath sounds: Normal breath sounds.  Abdominal:     General: Bowel sounds are normal.     Palpations: Abdomen is soft.  Musculoskeletal:        General: Normal range of motion.     Cervical back: Normal range of motion and neck supple.  Skin:    General: Skin is warm.     Capillary Refill: Capillary refill takes less than 2 seconds.  Neurological:     General: No focal deficit present.     Mental Status: She is alert and oriented to person, place, and time.  Psychiatric:        Mood and Affect: Mood normal.        Behavior: Behavior normal.     ED Results / Procedures / Treatments   Labs (all labs ordered are listed, but only abnormal results are displayed) Labs Reviewed  BASIC METABOLIC PANEL - Abnormal; Notable for the following components:      Result Value   Glucose, Bld 123 (*)    All other components within normal limits  CBC  D-DIMER, QUANTITATIVE (NOT AT [facility])  HEPATIC FUNCTION PANEL  I-STAT BETA HCG BLOOD, ED ([facility], [facility], [facility] ONLY)  TROPONIN I (HIGH SENSITIVITY)  TROPONIN I (HIGH SENSITIVITY)    EKG EKG Interpretation  Date/Time:  Tuesday March 27 2020 06:36:22 EDT Ventricular Rate:  92 PR Interval:  164 QRS Duration: 66 QT Interval:  334 QTC Calculation: 413 R Axis:   11 Text Interpretation: Normal sinus rhythm Low voltage QRS Cannot rule out Anterior infarct , age undetermined Abnormal ECG No old tracing to compare Confirmed by Isla Pence [phone]) on 03/27/2020 11:11:33 AM   Radiology DG Chest 2 View  Result Date: 03/27/2020 CLINICAL DATA:  Left superior chest pain with persistent belching EXAM: CHEST - 2 VIEW COMPARISON:  None. FINDINGS: The heart size and mediastinal contours are within normal limits. Both lungs are clear. The visualized skeletal structures are unremarkable. IMPRESSION: No active cardiopulmonary disease. Electronically Signed    By: Monte Fantasia M.D.   On: 03/27/2020 07:38    Procedures Procedures (including critical care time)  Medications Ordered in ED Medications  sodium chloride flush (NS) 0.9 % injection 3 mL (has no administration in time range)  alum & mag hydroxide-simeth (MAALOX/MYLANTA) 200-200-20 MG/5ML suspension 30 mL (30 mLs Oral Given 03/27/20 1203)  And  lidocaine (XYLOCAINE) 2 % viscous mouth solution 15 mL (15 mLs Oral Given 03/27/20 1203)    ED Course  I have reviewed the triage vital signs and the nursing notes.  Pertinent labs & imaging results that were available during my care of the patient were reviewed by me and considered in my medical decision making (see chart for details).    MDM Rules/Calculators/A&P                      Sx have improved after GI cocktail.  Cardiac work up is neg.  ddimer neg.  Pt is stable for d/c.  Return if worse.  Final Clinical Impression(s) / ED Diagnoses Final diagnoses:  Atypical chest pain    Rx / DC Orders ED Discharge Orders         Ordered    famotidine (PEPCID) 20 MG tablet  Daily     03/27/20 1327           Isla Pence, MD 03/27/20 1328

## 2020-03-27 NOTE — ED Notes (Signed)
Patient verbalizes understanding of discharge instructions. Opportunity for questioning and answers were provided. Armband removed by staff, pt discharged from ED to home via POV  

## 2020-03-27 NOTE — ED Triage Notes (Signed)
Pt reports that she has been having CP since yesterday, denies SOB or nausea. Feels like indigestion

## 2020-03-28 DIAGNOSIS — Z124 Encounter for screening for malignant neoplasm of cervix: Secondary | ICD-10-CM | POA: Diagnosis not present

## 2020-03-28 DIAGNOSIS — Z6837 Body mass index (BMI) 37.0-37.9, adult: Secondary | ICD-10-CM | POA: Diagnosis not present

## 2020-03-28 DIAGNOSIS — Z01419 Encounter for gynecological examination (general) (routine) without abnormal findings: Secondary | ICD-10-CM | POA: Diagnosis not present

## 2020-03-28 DIAGNOSIS — Z1151 Encounter for screening for human papillomavirus (HPV): Secondary | ICD-10-CM | POA: Diagnosis not present

## 2020-04-11 DIAGNOSIS — C50919 Malignant neoplasm of unspecified site of unspecified female breast: Secondary | ICD-10-CM | POA: Diagnosis not present

## 2020-04-11 DIAGNOSIS — Z6837 Body mass index (BMI) 37.0-37.9, adult: Secondary | ICD-10-CM | POA: Diagnosis not present

## 2020-04-11 DIAGNOSIS — Z0000 Encounter for general adult medical examination without abnormal findings: Secondary | ICD-10-CM | POA: Diagnosis not present

## 2020-04-11 DIAGNOSIS — J309 Allergic rhinitis, unspecified: Secondary | ICD-10-CM | POA: Diagnosis not present

## 2020-04-12 DIAGNOSIS — R7309 Other abnormal glucose: Secondary | ICD-10-CM | POA: Diagnosis not present

## 2020-04-12 DIAGNOSIS — N951 Menopausal and female climacteric states: Secondary | ICD-10-CM | POA: Diagnosis not present

## 2020-04-12 DIAGNOSIS — R635 Abnormal weight gain: Secondary | ICD-10-CM | POA: Diagnosis not present

## 2020-05-04 DIAGNOSIS — Z1339 Encounter for screening examination for other mental health and behavioral disorders: Secondary | ICD-10-CM | POA: Diagnosis not present

## 2020-05-04 DIAGNOSIS — R635 Abnormal weight gain: Secondary | ICD-10-CM | POA: Diagnosis not present

## 2020-05-04 DIAGNOSIS — Z1331 Encounter for screening for depression: Secondary | ICD-10-CM | POA: Diagnosis not present

## 2020-05-21 DIAGNOSIS — Z6837 Body mass index (BMI) 37.0-37.9, adult: Secondary | ICD-10-CM | POA: Diagnosis not present

## 2020-05-21 DIAGNOSIS — R7309 Other abnormal glucose: Secondary | ICD-10-CM | POA: Diagnosis not present

## 2020-05-31 DIAGNOSIS — R7309 Other abnormal glucose: Secondary | ICD-10-CM | POA: Diagnosis not present

## 2020-05-31 DIAGNOSIS — E7800 Pure hypercholesterolemia, unspecified: Secondary | ICD-10-CM | POA: Diagnosis not present

## 2020-05-31 DIAGNOSIS — Z6836 Body mass index (BMI) 36.0-36.9, adult: Secondary | ICD-10-CM | POA: Diagnosis not present

## 2020-06-15 DIAGNOSIS — R7301 Impaired fasting glucose: Secondary | ICD-10-CM | POA: Diagnosis not present

## 2020-06-15 DIAGNOSIS — E7800 Pure hypercholesterolemia, unspecified: Secondary | ICD-10-CM | POA: Diagnosis not present

## 2020-06-15 DIAGNOSIS — Z6836 Body mass index (BMI) 36.0-36.9, adult: Secondary | ICD-10-CM | POA: Diagnosis not present

## 2020-06-28 DIAGNOSIS — R7301 Impaired fasting glucose: Secondary | ICD-10-CM | POA: Diagnosis not present

## 2020-07-06 DIAGNOSIS — Z6835 Body mass index (BMI) 35.0-35.9, adult: Secondary | ICD-10-CM | POA: Diagnosis not present

## 2020-07-06 DIAGNOSIS — R7309 Other abnormal glucose: Secondary | ICD-10-CM | POA: Diagnosis not present

## 2020-07-12 DIAGNOSIS — Z79810 Long term (current) use of selective estrogen receptor modulators (SERMs): Secondary | ICD-10-CM | POA: Diagnosis not present

## 2020-07-12 DIAGNOSIS — D0512 Intraductal carcinoma in situ of left breast: Secondary | ICD-10-CM | POA: Diagnosis not present

## 2020-08-02 DIAGNOSIS — Z6835 Body mass index (BMI) 35.0-35.9, adult: Secondary | ICD-10-CM | POA: Diagnosis not present

## 2020-08-02 DIAGNOSIS — E7800 Pure hypercholesterolemia, unspecified: Secondary | ICD-10-CM | POA: Diagnosis not present

## 2020-08-23 DIAGNOSIS — E7800 Pure hypercholesterolemia, unspecified: Secondary | ICD-10-CM | POA: Diagnosis not present

## 2020-08-23 DIAGNOSIS — Z6834 Body mass index (BMI) 34.0-34.9, adult: Secondary | ICD-10-CM | POA: Diagnosis not present

## 2020-09-10 DIAGNOSIS — E7800 Pure hypercholesterolemia, unspecified: Secondary | ICD-10-CM | POA: Diagnosis not present

## 2020-09-10 DIAGNOSIS — Z6834 Body mass index (BMI) 34.0-34.9, adult: Secondary | ICD-10-CM | POA: Diagnosis not present

## 2020-09-28 DIAGNOSIS — E7800 Pure hypercholesterolemia, unspecified: Secondary | ICD-10-CM | POA: Diagnosis not present

## 2020-09-28 DIAGNOSIS — N951 Menopausal and female climacteric states: Secondary | ICD-10-CM | POA: Diagnosis not present

## 2020-09-28 DIAGNOSIS — E8881 Metabolic syndrome: Secondary | ICD-10-CM | POA: Diagnosis not present

## 2020-10-25 DIAGNOSIS — Z6833 Body mass index (BMI) 33.0-33.9, adult: Secondary | ICD-10-CM | POA: Diagnosis not present

## 2020-10-25 DIAGNOSIS — R7309 Other abnormal glucose: Secondary | ICD-10-CM | POA: Diagnosis not present

## 2020-10-25 DIAGNOSIS — E7800 Pure hypercholesterolemia, unspecified: Secondary | ICD-10-CM | POA: Diagnosis not present

## 2020-11-06 ENCOUNTER — Encounter: Payer: Self-pay | Admitting: Emergency Medicine

## 2020-11-06 ENCOUNTER — Other Ambulatory Visit: Payer: Self-pay

## 2020-11-06 ENCOUNTER — Observation Stay: Payer: BC Managed Care – PPO

## 2020-11-06 ENCOUNTER — Observation Stay
Admission: EM | Admit: 2020-11-06 | Discharge: 2020-11-08 | Disposition: A | Discharge status: Home / Self Care | Payer: BC Managed Care – PPO | Attending: Internal Medicine | Admitting: Internal Medicine

## 2020-11-06 DIAGNOSIS — K625 Hemorrhage of anus and rectum: Secondary | ICD-10-CM | POA: Diagnosis not present

## 2020-11-06 DIAGNOSIS — K253 Acute gastric ulcer without hemorrhage or perforation: Secondary | ICD-10-CM | POA: Diagnosis not present

## 2020-11-06 DIAGNOSIS — C50919 Malignant neoplasm of unspecified site of unspecified female breast: Secondary | ICD-10-CM | POA: Diagnosis present

## 2020-11-06 DIAGNOSIS — Z7982 Long term (current) use of aspirin: Secondary | ICD-10-CM | POA: Insufficient documentation

## 2020-11-06 DIAGNOSIS — Z853 Personal history of malignant neoplasm of breast: Secondary | ICD-10-CM | POA: Insufficient documentation

## 2020-11-06 DIAGNOSIS — K922 Gastrointestinal hemorrhage, unspecified: Secondary | ICD-10-CM | POA: Diagnosis not present

## 2020-11-06 DIAGNOSIS — R1030 Lower abdominal pain, unspecified: Secondary | ICD-10-CM | POA: Diagnosis not present

## 2020-11-06 DIAGNOSIS — Z8719 Personal history of other diseases of the digestive system: Secondary | ICD-10-CM | POA: Diagnosis not present

## 2020-11-06 DIAGNOSIS — K219 Gastro-esophageal reflux disease without esophagitis: Secondary | ICD-10-CM

## 2020-11-06 DIAGNOSIS — D62 Acute posthemorrhagic anemia: Secondary | ICD-10-CM

## 2020-11-06 DIAGNOSIS — U071 COVID-19: Secondary | ICD-10-CM | POA: Diagnosis not present

## 2020-11-06 DIAGNOSIS — R918 Other nonspecific abnormal finding of lung field: Secondary | ICD-10-CM | POA: Diagnosis not present

## 2020-11-06 LAB — CBC
HCT: 28.3 % — ABNORMAL LOW (ref 36.0–46.0)
HCT: 28.9 % — ABNORMAL LOW (ref 36.0–46.0)
Hemoglobin: 9.2 g/dL — ABNORMAL LOW (ref 12.0–15.0)
Hemoglobin: 9.2 g/dL — ABNORMAL LOW (ref 12.0–15.0)
MCH: 27.3 pg (ref 26.0–34.0)
MCH: 27.1 pg (ref 26.0–34.0)
MCHC: 32.5 g/dL (ref 30.0–36.0)
MCHC: 31.8 g/dL (ref 30.0–36.0)
MCV: 84 fL (ref 80.0–100.0)
MCV: 85 fL (ref 80.0–100.0)
Platelets: 237 10*3/uL (ref 150–400)
Platelets: 272 10*3/uL (ref 150–400)
RBC: 3.37 MIL/uL — ABNORMAL LOW (ref 3.87–5.11)
RBC: 3.4 MIL/uL — ABNORMAL LOW (ref 3.87–5.11)
RDW: 14.1 % (ref 11.5–15.5)
RDW: 14.3 % (ref 11.5–15.5)
WBC: 5.8 10*3/uL (ref 4.0–10.5)
WBC: 11.9 10*3/uL — ABNORMAL HIGH (ref 4.0–10.5)
nRBC: 0 % (ref 0.0–0.2)
nRBC: 0 % (ref 0.0–0.2)

## 2020-11-06 LAB — URINALYSIS, COMPLETE (UACMP) WITH MICROSCOPIC
Bacteria, UA: NONE SEEN
Bilirubin Urine: NEGATIVE
Glucose, UA: NEGATIVE mg/dL
Hgb urine dipstick: NEGATIVE
Ketones, ur: NEGATIVE mg/dL
Nitrite: NEGATIVE
Protein, ur: NEGATIVE mg/dL
Specific Gravity, Urine: 1.026 (ref 1.005–1.030)
pH: 5 (ref 5.0–8.0)

## 2020-11-06 LAB — COMPREHENSIVE METABOLIC PANEL
ALT: 23 U/L (ref 0–44)
AST: 24 U/L (ref 15–41)
Albumin: 3.1 g/dL — ABNORMAL LOW (ref 3.5–5.0)
Alkaline Phosphatase: 48 U/L (ref 38–126)
Anion gap: 8 (ref 5–15)
BUN: 24 mg/dL — ABNORMAL HIGH (ref 6–20)
CO2: 25 mmol/L (ref 22–32)
Calcium: 8.8 mg/dL — ABNORMAL LOW (ref 8.9–10.3)
Chloride: 106 mmol/L (ref 98–111)
Creatinine, Ser: 0.81 mg/dL (ref 0.44–1.00)
GFR, Estimated: >60 mL/min (ref >60)
Glucose, Bld: 143 mg/dL — ABNORMAL HIGH (ref 70–99)
Potassium: 3.7 mmol/L (ref 3.5–5.1)
Sodium: 139 mmol/L (ref 135–145)
Total Bilirubin: 0.5 mg/dL (ref 0.3–1.2)
Total Protein: 6.4 g/dL — ABNORMAL LOW (ref 6.5–8.1)

## 2020-11-06 LAB — FERRITIN: Ferritin: 21 ng/mL (ref 11–307)

## 2020-11-06 LAB — CBC WITH DIFFERENTIAL/PLATELET
Abs Immature Granulocytes: 0.01 10*3/uL (ref 0.00–0.07)
Basophils Absolute: 0 10*3/uL (ref 0.0–0.1)
Basophils Relative: 0 %
Eosinophils Absolute: 0.1 10*3/uL (ref 0.0–0.5)
Eosinophils Relative: 2 %
HCT: 28.9 % — ABNORMAL LOW (ref 36.0–46.0)
Hemoglobin: 9.3 g/dL — ABNORMAL LOW (ref 12.0–15.0)
Immature Granulocytes: 0 %
Lymphocytes Relative: 45 %
Lymphs Abs: 2.5 10*3/uL (ref 0.7–4.0)
MCH: 27.4 pg (ref 26.0–34.0)
MCHC: 32.2 g/dL (ref 30.0–36.0)
MCV: 85 fL (ref 80.0–100.0)
Monocytes Absolute: 0.6 10*3/uL (ref 0.1–1.0)
Monocytes Relative: 11 %
Neutro Abs: 2.4 10*3/uL (ref 1.7–7.7)
Neutrophils Relative %: 42 %
Platelets: 234 10*3/uL (ref 150–400)
RBC: 3.4 MIL/uL — ABNORMAL LOW (ref 3.87–5.11)
RDW: 14.3 % (ref 11.5–15.5)
WBC: 5.6 10*3/uL (ref 4.0–10.5)
nRBC: 0 % (ref 0.0–0.2)

## 2020-11-06 LAB — IRON AND TIBC
Iron: 42 ug/dL (ref 28–170)
Saturation Ratios: 11 % (ref 10.4–31.8)
TIBC: 392 ug/dL (ref 250–450)
UIBC: 350 ug/dL

## 2020-11-06 LAB — LIPASE, BLOOD: Lipase: 26 U/L (ref 11–51)

## 2020-11-06 LAB — PROTIME-INR
INR: 1 (ref 0.8–1.2)
Prothrombin Time: 13.1 seconds (ref 11.4–15.2)

## 2020-11-06 LAB — RESP PANEL BY RT-PCR (FLU A&B, COVID) ARPGX2
Influenza A by PCR: NEGATIVE
Influenza B by PCR: NEGATIVE
SARS Coronavirus 2 by RT PCR: POSITIVE — AB

## 2020-11-06 LAB — FOLATE: Folate: 10.2 ng/mL (ref >5.9)

## 2020-11-06 LAB — APTT: aPTT: <24 seconds — ABNORMAL LOW (ref 24–36)

## 2020-11-06 MED ORDER — ALBUTEROL SULFATE HFA 108 (90 BASE) MCG/ACT IN AERS
2.0000 | INHALATION_SPRAY | RESPIRATORY_TRACT | Status: DC | PRN
  Filled 2020-11-06: qty 6.7

## 2020-11-06 MED ORDER — CALCIUM CARBONATE 1250 (500 CA) MG PO TABS
1.0000 | ORAL_TABLET | Freq: Two times a day (BID) | ORAL | Status: DC
  Administered 2020-11-07 – 2020-11-08 (×2): 500 mg via ORAL
  Filled 2020-11-06 (×4): qty 1

## 2020-11-06 MED ORDER — TAMOXIFEN CITRATE 10 MG PO TABS
10.0000 mg | ORAL_TABLET | Freq: Every day | ORAL | Status: DC
  Administered 2020-11-07 – 2020-11-08 (×2): 10 mg via ORAL
  Filled 2020-11-06 (×3): qty 1

## 2020-11-06 MED ORDER — LORATADINE 10 MG PO TABS
10.0000 mg | ORAL_TABLET | Freq: Every day | ORAL | Status: DC
  Administered 2020-11-07 – 2020-11-08 (×2): 10 mg via ORAL
  Filled 2020-11-06 (×2): qty 1

## 2020-11-06 MED ORDER — ADULT MULTIVITAMIN W/MINERALS CH
1.0000 | ORAL_TABLET | Freq: Every day | ORAL | Status: DC
  Administered 2020-11-08: 1 via ORAL
  Filled 2020-11-06 (×2): qty 1

## 2020-11-06 MED ORDER — MAGNESIUM CITRATE PO SOLN
1.0000 | Freq: Once | ORAL | Status: AC
  Administered 2020-11-06: 1 via ORAL
  Filled 2020-11-06: qty 296

## 2020-11-06 MED ORDER — DM-GUAIFENESIN ER 30-600 MG PO TB12: 1.0000 | ORAL_TABLET | Freq: Two times a day (BID) | ORAL | Status: DC | PRN

## 2020-11-06 MED ORDER — SODIUM CHLORIDE 0.9 % IV BOLUS
1000.0000 mL | Freq: Once | INTRAVENOUS | Status: AC
  Administered 2020-11-06: 1000 mL via INTRAVENOUS

## 2020-11-06 MED ORDER — SODIUM CHLORIDE 0.9 % IV SOLN: INTRAVENOUS | Status: DC

## 2020-11-06 MED ORDER — MORPHINE SULFATE (PF) 2 MG/ML IV SOLN: 2.0000 mg | INTRAVENOUS | Status: DC | PRN

## 2020-11-06 MED ORDER — SODIUM CHLORIDE 0.9 % IV SOLN
300.0000 mg | Freq: Once | INTRAVENOUS | Status: AC
  Administered 2020-11-06: 300 mg via INTRAVENOUS
  Filled 2020-11-06: qty 15

## 2020-11-06 MED ORDER — PANTOPRAZOLE SODIUM 40 MG IV SOLR
40.0000 mg | Freq: Two times a day (BID) | INTRAVENOUS | Status: DC
  Administered 2020-11-06 – 2020-11-08 (×5): 40 mg via INTRAVENOUS
  Filled 2020-11-06 (×5): qty 40

## 2020-11-06 MED ORDER — ONDANSETRON HCL 4 MG/2ML IJ SOLN: 4.0000 mg | Freq: Three times a day (TID) | INTRAMUSCULAR | Status: DC | PRN

## 2020-11-06 MED ORDER — SODIUM CHLORIDE 0.9 % IV SOLN
INTRAVENOUS | Status: DC
  Administered 2020-11-06: 10 mL/h via INTRAVENOUS

## 2020-11-06 MED ORDER — ACETAMINOPHEN 325 MG PO TABS: 650.0000 mg | ORAL_TABLET | Freq: Four times a day (QID) | ORAL | Status: DC | PRN

## 2020-11-06 MED ORDER — POLYETHYLENE GLYCOL 3350 17 GM/SCOOP PO POWD
1.0000 | Freq: Once | ORAL | Status: AC
  Administered 2020-11-06: 255 g via ORAL
  Filled 2020-11-06: qty 255

## 2020-11-06 NOTE — H&P (Signed)
History and Physical    Jasmine Meadows [id number] DOB: 1966-07-12 DOA: 11/06/2020  Referring MD/NP/PA:   PCP: Kathyrn Lass, MD   Patient coming from:  The patient is coming from home.  At baseline, pt is independent for most of ADL.        Chief Complaint: Rectal bleeding  HPI: Jasmine Meadows is a 55 y.o. female with medical history significant of breast cancer on tamoxifen (s/p radiation therapy), uterine fibroid, GERD, carpal tunnel syndrome, who presents with several episodes of rectal bleeding.  Patient states she has had 3 episodes of rectal bleeding with bright red blood since last night.  She had mild sharp lower abdominal pain earlier, which has improved. Patient states this morning she was feeling dizzy after her last bloody bowel movement. Pt denies any vomiting or nausea.    Patient does not have cough, shortness of breath, chest pain, fever or chills.  No symptoms of UTI.  No unilateral numbness or tingling extremities.  ED Course: pt was found to have hemoglobin 13.0 on 03/27/2020 -->9.3, WBC 5.6, pending COVID-19 PCR, electrolytes renal function okay, lipase 26, temperature normal, blood pressure 121/81, heart rate 94, RR 18, oxygen saturation 100% on room air.  Patient is placed on MedSurg for obs. Dr. Marius Ditch of GI is consulted.   Review of Systems:   General: no fevers, chills, no body weight gain, fatigue HEENT: no blurry vision, hearing changes or sore throat Respiratory: no dyspnea, coughing, wheezing CV: no chest pain, no palpitations GI: no nausea, vomiting, has abdominal pain, and rectal bleeding, no constipation GU: no dysuria, burning on urination, increased urinary frequency, hematuria  Ext: no leg edema Neuro: no unilateral weakness, numbness, or tingling, no vision change or hearing loss. Has dizzness Skin: no rash, no skin tear. MSK: No muscle spasm, no deformity, no limitation of range of movement in spin Heme: No easy bruising.  Travel  history: No recent long distant travel.  Allergy:  Allergies  Allergen Reactions  . Other Anaphylaxis    Past Medical History:  Diagnosis Date  . Bilateral hand pain   . Breast cancer ([place])    left - radiation only  . Carpal tunnel syndrome of left wrist   . Carpal tunnel syndrome of right wrist 04/2012  . Environmental allergies   . PONV (postoperative nausea and vomiting)   . Seasonal allergies   . Uterine fibroid    Lupron injection every 3 mos.    Past Surgical History:  Procedure Laterality Date  . BREAST REDUCTION SURGERY    . CARPAL TUNNEL RELEASE  03/19/2012   Procedure: CARPAL TUNNEL RELEASE;  Surgeon: Wynonia Sours, MD;  Location: [place];  Service: Orthopedics;  Laterality: Left;  . CARPAL TUNNEL RELEASE  05/07/2012   Procedure: CARPAL TUNNEL RELEASE;  Surgeon: Wynonia Sours, MD;  Location: [place];  Service: Orthopedics;  Laterality: Right;  . MASS EXCISION Left 03/31/2014   Procedure: EXCISION CYST ;  Surgeon: Wynonia Sours, MD;  Location: [place];  Service: Orthopedics;  Laterality: Left;  ANESTHESIA:  IV REGIONAL FAB  . MYOMECTOMY    . TRIGGER FINGER RELEASE Left 03/31/2014   Procedure: RELEASE A-1 PULLEY LEFT RING FINGER;  Surgeon: Wynonia Sours, MD;  Location: [place];  Service: Orthopedics;  Laterality: Left;    Social History:  reports that she has never smoked. She has never used smokeless tobacco. She reports that she does not  drink alcohol and does not use drugs.  Family History:  Family History  Problem Relation Age of Onset  . Asthma Other   . Hypertension Other   . Transient ischemic attack Mother   . Hypertension Mother   . Asthma Mother   . Dementia Father      Prior to Admission medications   Medication Sig Start Date End Date Taking? Authorizing Provider  aspirin EC 81 MG tablet Take 81 mg by mouth daily. 12/05/15   [provider]  calcium carbonate (OS-CAL) 600 MG  TABS Take 600 mg by mouth 2 (two) times daily with a meal.    [provider]  cetirizine (ZYRTEC) 5 MG tablet Take 5 mg by mouth daily.    [provider]  famotidine (PEPCID) 20 MG tablet Take 1 tablet (20 mg total) by mouth daily. 03/27/20   Isla Pence, MD  HYDROcodone-acetaminophen (NORCO) 5-325 MG tablet Take 1 tablet by mouth every 6 (six) hours as needed for moderate pain. 10/10/17   Robyn Haber, MD  Multiple Vitamin (MULTI-VITAMINS) TABS Take 1 tablet by mouth daily.    [provider]  tamoxifen (NOLVADEX) 10 MG tablet Take 10 mg by mouth daily.    [provider]    Physical Exam: Vitals:   11/06/20 0034 11/06/20 0946 11/06/20 0948 11/06/20 1646  BP:  132/84 121/81 128/74  Pulse:  94  89  Resp:  18  18  Temp:    98.2 F (36.8 C)  TempSrc:    Oral  SpO2:  100%  99%  Weight: 82.1 kg     Height: 5\' 2"  (1.575 m)      General: Not in acute distress. Pale looking HEENT:       Eyes: PERRL, EOMI, no scleral icterus.       ENT: No discharge from the ears and nose, no pharynx injection, no tonsillar enlargement.        Neck: No JVD, no bruit, no mass felt. Heme: No neck lymph node enlargement. Cardiac: S1/S2, RRR, No murmurs, No gallops or rubs. Respiratory: No rales, wheezing, rhonchi or rubs. GI: Soft, nondistended, nontender, no rebound pain, no organomegaly, BS present. GU: No hematuria Ext: No pitting leg edema bilaterally. 2+DP/PT pulse bilaterally. Musculoskeletal: No joint deformities, No joint redness or warmth, no limitation of ROM in spin. Skin: No rashes.  Neuro: Alert, oriented X3, cranial nerves II-XII grossly intact, moves all extremities normally. Psych: Patient is not psychotic, no suicidal or hemocidal ideation.  Labs on Admission: I have personally reviewed following labs and imaging studies  CBC: Recent Labs  Lab 11/06/20 0037 11/06/20 1111  WBC 5.6 5.8  NEUTROABS 2.4  --   HGB 9.3* 9.2*  HCT 28.9* 28.3*   MCV 85.0 84.0  PLT 234 858   Basic Metabolic Panel: Recent Labs  Lab 11/06/20 0037  NA 139  K 3.7  CL 106  CO2 25  GLUCOSE 143*  BUN 24*  CREATININE 0.81  CALCIUM 8.8*   GFR: Estimated Creatinine Clearance: 77.9 mL/min (by C-G formula based on SCr of 0.81 mg/dL). Liver Function Tests: Recent Labs  Lab 11/06/20 0037  AST 24  ALT 23  ALKPHOS 48  BILITOT 0.5  PROT 6.4*  ALBUMIN 3.1*   Recent Labs  Lab 11/06/20 0037  LIPASE 26   No results for input(s): AMMONIA in the last 168 hours. Coagulation Profile: Recent Labs  Lab 11/06/20 1111  INR 1.0   Cardiac Enzymes: No results for input(s):  CKTOTAL, CKMB, CKMBINDEX, TROPONINI in the last 168 hours. BNP (last 3 results) No results for input(s): PROBNP in the last 8760 hours. HbA1C: No results for input(s): HGBA1C in the last 72 hours. CBG: No results for input(s): GLUCAP in the last 168 hours. Lipid Profile: No results for input(s): CHOL, HDL, LDLCALC, TRIG, CHOLHDL, LDLDIRECT in the last 72 hours. Thyroid Function Tests: No results for input(s): TSH, T4TOTAL, FREET4, T3FREE, THYROIDAB in the last 72 hours. Anemia Panel: Recent Labs    11/06/20 0037  FOLATE 10.2  FERRITIN 21  TIBC 392  IRON 42   Urine analysis:    Component Value Date/Time   COLORURINE YELLOW (A) 11/06/2020 0037   APPEARANCEUR HAZY (A) 11/06/2020 0037   LABSPEC 1.026 11/06/2020 0037   PHURINE 5.0 11/06/2020 0037   GLUCOSEU NEGATIVE 11/06/2020 0037   HGBUR NEGATIVE 11/06/2020 0037   BILIRUBINUR NEGATIVE 11/06/2020 0037   KETONESUR NEGATIVE 11/06/2020 0037   PROTEINUR NEGATIVE 11/06/2020 0037   UROBILINOGEN >=8.0 10/10/2017 1325   NITRITE NEGATIVE 11/06/2020 0037   LEUKOCYTESUR TRACE (A) 11/06/2020 0037   Sepsis Labs: @LABRCNTIP (procalcitonin:4,lacticidven:4) ) Recent Results (from the past 240 hour(s))  Resp Panel by RT-PCR (Flu A&B, Covid) Nasopharyngeal Swab     Status: Abnormal   Collection Time: 11/06/20 11:11 AM    Specimen: Nasopharyngeal Swab; Nasopharyngeal(NP) swabs in vial transport medium  Result Value Ref Range Status   SARS Coronavirus 2 by RT PCR POSITIVE (A) NEGATIVE Final    Comment: RESULT CALLED TO, READ BACK BY AND VERIFIED WITH: JENNIFER INGERSOLL 11/06/20 1225 KLW (NOTE) SARS-CoV-2 target nucleic acids are DETECTED.  The SARS-CoV-2 RNA is generally detectable in upper respiratory specimens during the acute phase of infection. Positive results are indicative of the presence of the identified virus, but do not rule out bacterial infection or co-infection with other pathogens not detected by the test. Clinical correlation with patient history and other diagnostic information is necessary to determine patient infection status. The expected result is Negative.  Fact Sheet for Patients: EntrepreneurPulse.com.au  Fact Sheet for Healthcare Providers: IncredibleEmployment.be  This test is not yet approved or cleared by the [place] FDA and  has been authorized for detection and/or diagnosis of SARS-CoV-2 by FDA under an Emergency Use Authorization (EUA).  This EUA will remain in effect (meaning this test can  be used) for the duration of  the COVID-19 declaration under Section 564(b)(1) of the Act, 21 U.S.C. section 360bbb-3(b)(1), unless the authorization is terminated or revoked sooner.     Influenza A by PCR NEGATIVE NEGATIVE Final   Influenza B by PCR NEGATIVE NEGATIVE Final    Comment: (NOTE) The Xpert Xpress SARS-CoV-2/FLU/RSV plus assay is intended as an aid in the diagnosis of influenza from Nasopharyngeal swab specimens and should not be used as a sole basis for treatment. Nasal washings and aspirates are unacceptable for Xpert Xpress SARS-CoV-2/FLU/RSV testing.  Fact Sheet for Patients: EntrepreneurPulse.com.au  Fact Sheet for Healthcare Providers: IncredibleEmployment.be  This test is not  yet approved or cleared by the [place] FDA and has been authorized for detection and/or diagnosis of SARS-CoV-2 by FDA under an Emergency Use Authorization (EUA). This EUA will remain in effect (meaning this test can be used) for the duration of the COVID-19 declaration under Section 564(b)(1) of the Act, 21 U.S.C. section 360bbb-3(b)(1), unless the authorization is terminated or revoked.  Performed at [facility], [address] [place], [place] [address]      Radiological Exams on Admission: DG  Chest Port 1 View  Result Date: 11/06/2020 CLINICAL DATA:  55 year old female with history of gastrointestinal hemorrhage EXAM: PORTABLE CHEST 1 VIEW COMPARISON:  03/27/2020 FINDINGS: Cardiomediastinal silhouette unchanged in size and contour. Low lung volumes. No pneumothorax or pleural effusion. No confluent airspace disease. Coarsened interstitial markings similar to the prior. No displaced fracture IMPRESSION: Low lung volumes without evidence of acute cardiopulmonary disease. Electronically Signed   By: Corrie Mckusick D.O.   On: 11/06/2020 12:50     EKG:  Not done in ED, will get one.   Assessment/Plan Principal Problem:   Rectal bleeding Active Problems:   Breast cancer ([facility]CC)   GERD (gastroesophageal reflux disease)   Acute blood loss anemia   COVID-19 virus infection   Rectal bleeding and acute blood loss anemia: Hgb dropped drom 13.0 --> 9.3 -->9.2.  Currently hemodynamically stable.  Dr. Terri Piedra for GI is consulted --> pper endoscopy as well as colonoscopy tomorrow.  - will place in med-surg bed obs - GI consulted by Ed, will follow up recommendations - IVF: 1L NS bolus, then at 75 mL/hr - Start IV pantoprazole 40 mg bid - hold ASA - Zofran IV for nausea - Avoid NSAIDs and SQ heparin - Maintain IV access (2 large bore IVs if possible). - Monitor closely and follow q6h cbc, transfuse as necessary, if Hgb<7.0 - LaB: INR, PTT and type screen, iron panel, B12  and folate levels  Breast cancer (HCC) -Tamoxifen  GERD (gastroesophageal reflux disease) -On Protonix IV  COVID-19 virus infection: Patient is asymptomatic.  Oxygen saturation 100% on room air.  Chest x-ray negative. -No treatment needed    DVT ppx: SCD Code Status: Full code Family Communication:    Yes, patient's husband  at bed side Disposition Plan:  Anticipate discharge back to previous environment Consults called:  Dr .Marius Ditch of GI Admission status: Med-surg bed for obs    Status is: Observation  The patient remains OBS appropriate and will d/c before 2 midnights.  Dispo: The patient is from: Home              Anticipated d/c is to: Home              Anticipated d/c date is: 1 day              Patient currently is not medically stable to d/c.          Date of Service 11/06/2020    Ivor Costa [facility]   If 7PM-7AM, please contact night-coverage [email or web address] 11/06/2020, 5:30 PM

## 2020-11-06 NOTE — ED Provider Notes (Signed)
[facility] Emergency Department Provider Note  Time seen: 10:01 AM  I have reviewed the triage vital signs and the nursing notes.   HISTORY  Chief Complaint Abdominal Pain and Rectal Bleeding   HPI Jasmine Meadows is a 55 y.o. female with a past medical history of breast cancer on tamoxifen presents to the emergency department for GI bleeding.  According to the patient last night she had a lower abdominal pain which she described as a mild sharp pain.  States she had to have a bowel movement which relieved the pain however she noticed bright red blood within the bowel movement.  Patient has since had 2 or 3 additional bowel movements all with bright red blood.  Patient states this morning she was feeling dizzy after her last bloody bowel movement.  So she came to the emergency department for evaluation.  Denies any vomiting or nausea.  Denies any abdominal pain.   Past Medical History:  Diagnosis Date  . Bilateral hand pain   . Breast cancer ([place])    left - radiation only  . Carpal tunnel syndrome of left wrist   . Carpal tunnel syndrome of right wrist 04/2012  . Environmental allergies   . PONV (postoperative nausea and vomiting)   . Seasonal allergies   . Uterine fibroid    Lupron injection every 3 mos.    Patient Active Problem List   Diagnosis Date Noted  . CTS (carpal tunnel syndrome) 02/13/2017  . Paresthesia 01/12/2017  . Weakness 01/12/2017  . Hyperreflexia 01/12/2017    Past Surgical History:  Procedure Laterality Date  . BREAST REDUCTION SURGERY    . CARPAL TUNNEL RELEASE  03/19/2012   Procedure: CARPAL TUNNEL RELEASE;  Surgeon: Wynonia Sours, MD;  Location: [place];  Service: Orthopedics;  Laterality: Left;  . CARPAL TUNNEL RELEASE  05/07/2012   Procedure: CARPAL TUNNEL RELEASE;  Surgeon: Wynonia Sours, MD;  Location: [place];  Service: Orthopedics;  Laterality: Right;  . MASS EXCISION Left 03/31/2014    Procedure: EXCISION CYST ;  Surgeon: Wynonia Sours, MD;  Location: [place];  Service: Orthopedics;  Laterality: Left;  ANESTHESIA:  IV REGIONAL FAB  . MYOMECTOMY    . TRIGGER FINGER RELEASE Left 03/31/2014   Procedure: RELEASE A-1 PULLEY LEFT RING FINGER;  Surgeon: Wynonia Sours, MD;  Location: [place];  Service: Orthopedics;  Laterality: Left;    Prior to Admission medications   Medication Sig Start Date End Date Taking? Authorizing Provider  aspirin EC 81 MG tablet Take 81 mg by mouth daily. 12/05/15   [provider]  calcium carbonate (OS-CAL) 600 MG TABS Take 600 mg by mouth 2 (two) times daily with a meal.    [provider]  cetirizine (ZYRTEC) 5 MG tablet Take 5 mg by mouth daily.    [provider]  famotidine (PEPCID) 20 MG tablet Take 1 tablet (20 mg total) by mouth daily. 03/27/20   Isla Pence, MD  HYDROcodone-acetaminophen (NORCO) 5-325 MG tablet Take 1 tablet by mouth every 6 (six) hours as needed for moderate pain. 10/10/17   Robyn Haber, MD  Multiple Vitamin (MULTI-VITAMINS) TABS Take 1 tablet by mouth daily.    [provider]  tamoxifen (NOLVADEX) 10 MG tablet Take 10 mg by mouth daily.    [provider]    Allergies  Allergen Reactions  . Other Anaphylaxis    Family History  Problem  Relation Age of Onset  . Asthma Other   . Hypertension Other   . Transient ischemic attack Mother   . Hypertension Mother   . Asthma Mother   . Dementia Father     Social History Social History   Tobacco Use  . Smoking status: Never Smoker  . Smokeless tobacco: Never Used  Substance Use Topics  . Alcohol use: No  . Drug use: No    Review of Systems Constitutional: Negative for fever.  Positive for dizziness. Cardiovascular: Negative for chest pain. Respiratory: Negative for shortness of breath. Gastrointestinal: Lower abdominal pain last night that resolved after having a bowel movement.   Has had approximately 3 bloody bowel movements. Genitourinary: Negative for urinary compaints Musculoskeletal: Negative for musculoskeletal complaints Neurological: Negative for headache All other ROS negative  ____________________________________________   PHYSICAL EXAM:  VITAL SIGNS: ED Triage Vitals  Enc Vitals Group     BP 11/06/20 0033 110/71     Pulse Rate 11/06/20 0033 100     Resp 11/06/20 0033 18     Temp 11/06/20 0033 98.7 F (37.1 C)     Temp Source 11/06/20 0033 Oral     SpO2 11/06/20 0033 100 %     Weight 11/06/20 0034 181 lb (82.1 kg)     Height 11/06/20 0034 5\' 2"  (1.575 m)     Head Circumference --      Peak Flow --      Pain Score 11/06/20 0033 8     Pain Loc --      Pain Edu? --      Excl. in [place]? --    Constitutional: Alert and oriented. Well appearing and in no distress. Eyes: Normal exam ENT      Head: Normocephalic and atraumatic.      Mouth/Throat: Mucous membranes are moist. Cardiovascular: Normal rate, regular rhythm around 100 bpm.  Respiratory: Normal respiratory effort without tachypnea nor retractions. Breath sounds are clear  Gastrointestinal: Soft and nontender. No distention.  Musculoskeletal: Nontender with normal range of motion in all extremities.  Neurologic:  Normal speech and language. No gross focal neurologic deficits  Skin:  Skin is warm, dry and intact.  Psychiatric: Mood and affect are normal.   ____________________________________________   INITIAL IMPRESSION / ASSESSMENT AND PLAN / ED COURSE  Pertinent labs & imaging results that were available during my care of the patient were reviewed by me and considered in my medical decision making (see chart for details).   Patient presents to the emergency department for bright red blood per rectum x3 events now with dizziness.  Patient's labs show 3.5 point decrease in hemoglobin compared to 7 months ago.  Rectal exam shows hematochezia.  Remainder the patient's lab work is largely  [place].  Patient has a benign abdominal exam.  However given the hemoglobin decrease along with bright red blood per rectum and hematochezia on exam patient will require admission to the hospital service for further work-up and treatment.  Patient agreeable to plan of care.  Does not require transfusion at this time.  Patient is mildly tachycardic we will dose 1 L of normal saline.  Jasmine Meadows was evaluated in Emergency Department on 11/06/2020 for the symptoms described in the history of present illness. She was evaluated in the context of the global COVID-19 pandemic, which necessitated consideration that the patient might be at risk for infection with the SARS-CoV-2 virus that causes COVID-19. Institutional protocols and algorithms that pertain to the evaluation of  patients at risk for COVID-19 are in a state of rapid change based on information released by regulatory bodies including the CDC and federal and state organizations. These policies and algorithms were followed during the patient's care in the ED.  ____________________________________________   FINAL CLINICAL IMPRESSION(S) / ED DIAGNOSES  Lower GI bleed   Harvest Dark, MD 11/06/20 1004

## 2020-11-06 NOTE — ED Triage Notes (Signed)
Pt to triage via w/c with no distress noted; st lower abd pain with rectal bleeding x 2 tonight

## 2020-11-06 NOTE — Consult Note (Signed)
Jasmine Darby, MD [address]  [place]  [place], [place] [address]  Main: [phone]  Fax: [phone] Pager: [phone]   Consultation  Referring Provider:     No ref. provider found Primary Care Physician:  Kathyrn Lass, MD Primary Gastroenterologist: Althia Forts         Reason for Consultation:     Rectal bleeding  Date of Admission:  11/06/2020 Date of Consultation:  11/06/2020         HPI:   Jasmine Meadows is a 55 y.o. 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  Mildly elevated BUN/creatinine 24/0.81 compared to baseline.  Albumin 3.1, otherwise normal LFTs. UA negative for hematuria.  Patient presented to ER last night secondary to lower abdominal pain associated with 3-4 episodes of bright red blood per rectum and she felt dizzy after last bowel movement.  She was mildly tachycardic, otherwise normal vitals.  No evidence of leukocytosis.  GI is consulted to evaluate for rectal bleeding.  Patient received a liter bolus of IV fluids.  SARS COVID-19 positive  Patient's husband is bedside  NSAIDs: Took Advil 2 tablets a week ago  Antiplts/Anticoagulants/Anti thrombotics: None  GI Procedures: Reports having had a colonoscopy at age 23, reportedly normal, records not available She denies family history of GU malignancy  Past Medical History:  Diagnosis Date  . Bilateral hand pain   . Breast cancer ([place])    left - radiation only  . Carpal tunnel syndrome of left wrist   . Carpal tunnel syndrome of right wrist 04/2012  . Environmental allergies   . PONV (postoperative nausea and vomiting)   . Seasonal allergies   . Uterine fibroid    Lupron injection every 3 mos.    Past Surgical History:  Procedure Laterality Date  . BREAST REDUCTION SURGERY    .  CARPAL TUNNEL RELEASE  03/19/2012   Procedure: CARPAL TUNNEL RELEASE;  Surgeon: Wynonia Sours, MD;  Location: [place];  Service: Orthopedics;  Laterality: Left;  . CARPAL TUNNEL RELEASE  05/07/2012   Procedure: CARPAL TUNNEL RELEASE;  Surgeon: Wynonia Sours, MD;  Location: [place];  Service: Orthopedics;  Laterality: Right;  . MASS EXCISION Left 03/31/2014   Procedure: EXCISION CYST ;  Surgeon: Wynonia Sours, MD;  Location: [place];  Service: Orthopedics;  Laterality: Left;  ANESTHESIA:  IV REGIONAL FAB  . MYOMECTOMY    . TRIGGER FINGER RELEASE Left 03/31/2014   Procedure: RELEASE A-1 PULLEY LEFT RING FINGER;  Surgeon: Wynonia Sours, MD;  Location: [place];  Service: Orthopedics;  Laterality: Left;    Prior to Admission medications   Medication Sig Start Date End Date Taking? Authorizing Provider  aspirin EC 81 MG tablet Take 81 mg by mouth daily. 12/05/15  Yes [provider]  calcium carbonate (OS-CAL) 600 MG TABS Take 600 mg by mouth 2 (two) times daily with a meal.   Yes [provider]  cetirizine (ZYRTEC) 5 MG tablet Take 5 mg by mouth daily.   Yes [provider]  Multiple Vitamin (MULTI-VITAMINS) TABS Take 1 tablet by mouth daily.   Yes [provider]  tamoxifen (NOLVADEX) 10 MG tablet Take 10 mg by mouth daily.   Yes [provider]  famotidine (PEPCID)  20 MG tablet Take 1 tablet (20 mg total) by mouth daily. Patient not taking: No sig reported 03/27/20   Isla Pence, MD  HYDROcodone-acetaminophen (NORCO) 5-325 MG tablet Take 1 tablet by mouth every 6 (six) hours as needed for moderate pain. Patient not taking: No sig reported 10/10/17   Robyn Haber, MD   Current Facility-Administered Medications:  .  0.9 %  sodium chloride infusion, , Intravenous, Continuous, Ivor Costa, MD .  0.9 %  sodium chloride infusion, , Intravenous, Continuous, Avalina Benko, Tally Due, MD .   acetaminophen (TYLENOL) tablet 650 mg, 650 mg, Oral, Q6H PRN, Ivor Costa, MD .  albuterol (VENTOLIN HFA) 108 (90 Base) MCG/ACT inhaler 2 puff, 2 puff, Inhalation, Q4H PRN, Ivor Costa, MD .  dextromethorphan-guaiFENesin ([place] DM) 30-600 MG per 12 hr tablet 1 tablet, 1 tablet, Oral, BID PRN, Ivor Costa, MD .  iron sucrose (VENOFER) 300 mg in sodium chloride 0.9 % 250 mL IVPB, 300 mg, Intravenous, Once, Alexsis Kathman, Tally Due, MD .  magnesium citrate solution 1 Bottle, 1 Bottle, Oral, Once, Joni Norrod, Tally Due, MD .  morphine 2 MG/ML injection 2 mg, 2 mg, Intravenous, Q4H PRN, Ivor Costa, MD .  ondansetron (ZOFRAN) injection 4 mg, 4 mg, Intravenous, Q8H PRN, Ivor Costa, MD .  pantoprazole (PROTONIX) injection 40 mg, 40 mg, Intravenous, Q12H, Niu, Xilin, MD .  polyethylene glycol powder (GLYCOLAX/MIRALAX) container 255 g, 1 Container, Oral, Once, Alixis Harmon, Tally Due, MD  Current Outpatient Medications:  .  aspirin EC 81 MG tablet, Take 81 mg by mouth daily., Disp: , Rfl:  .  calcium carbonate (OS-CAL) 600 MG TABS, Take 600 mg by mouth 2 (two) times daily with a meal., Disp: , Rfl:  .  cetirizine (ZYRTEC) 5 MG tablet, Take 5 mg by mouth daily., Disp: , Rfl:  .  Multiple Vitamin (MULTI-VITAMINS) TABS, Take 1 tablet by mouth daily., Disp: , Rfl:  .  tamoxifen (NOLVADEX) 10 MG tablet, Take 10 mg by mouth daily., Disp: , Rfl:  .  famotidine (PEPCID) 20 MG tablet, Take 1 tablet (20 mg total) by mouth daily. (Patient not taking: No sig reported), Disp: 30 tablet, Rfl: 0 .  HYDROcodone-acetaminophen (NORCO) 5-325 MG tablet, Take 1 tablet by mouth every 6 (six) hours as needed for moderate pain. (Patient not taking: No sig reported), Disp: 12 tablet, Rfl: 0   Family History  Problem Relation Age of Onset  . Asthma Other   . Hypertension Other   . Transient ischemic attack Mother   . Hypertension Mother   . Asthma Mother   . Dementia Father      Social History   Tobacco Use  . Smoking status:  Never Smoker  . Smokeless tobacco: Never Used  Substance Use Topics  . Alcohol use: No  . Drug use: No    Allergies as of 11/06/2020 - Review Complete 11/06/2020  Allergen Reaction Noted  . Other Anaphylaxis 03/19/2016    Review of Systems:    All systems reviewed and negative except where noted in HPI.   Physical Exam:  Vital signs in last 24 hours: Temp:  [98.7 F (37.1 C)] 98.7 F (37.1 C) (01/18 0033) Pulse Rate:  [94-100] 94 (01/18 0946) Resp:  [18] 18 (01/18 0946) BP: (110-132)/(71-84) 121/81 (01/18 0948) SpO2:  [100 %] 100 % (01/18 0946) Weight:  [82.1 kg] 82.1 kg (01/18 0034)   General:   Pleasant, cooperative in NAD Head:  Normocephalic and atraumatic. Eyes:   No icterus.   Conjunctiva  pink. PERRLA. Ears:  Normal auditory acuity. Neck:  Supple; no masses or thyroidomegaly Lungs: Respirations even and unlabored. Lungs clear to auscultation bilaterally.   No wheezes, crackles, or rhonchi.  Heart:  Regular rate and rhythm;  Without murmur, clicks, rubs or gallops Abdomen:  Soft, nondistended, nontender. Normal bowel sounds. No appreciable masses or hepatomegaly.  No rebound or guarding.  Rectal:  Not performed. Msk:  Symmetrical without gross deformities.  Strength generalized weakness Extremities:  Without edema, cyanosis or clubbing. Neurologic:  Alert and oriented x3;  grossly normal neurologically. Skin:  Intact without significant lesions or rashes. Psych:  Alert and cooperative. Normal affect.  LAB RESULTS: CBC Latest Ref Rng & Units 11/06/2020 11/06/2020 03/27/2020  WBC 4.0 - 10.5 K/uL 5.8 5.6 6.4  Hemoglobin 12.0 - 15.0 g/dL 9.2(L) 9.3(L) 13.0  Hematocrit 36.0 - 46.0 % 28.3(L) 28.9(L) 40.4  Platelets 150 - 400 K/uL 237 234 239    BMET BMP Latest Ref Rng & Units 11/06/2020 03/27/2020 01/12/2017  Glucose 70 - 99 mg/dL 143(H) 123(H) 108(H)  BUN 6 - 20 mg/dL 24(H) 11 15  Creatinine 0.44 - 1.00 mg/dL 0.81 0.87 0.73  BUN/Creat Ratio 9 - 23 - - 21  Sodium 135 -  145 mmol/L 139 139 143  Potassium 3.5 - 5.1 mmol/L 3.7 4.0 4.9  Chloride 98 - 111 mmol/L 106 105 104  CO2 22 - 32 mmol/L $RemoveB'25 25 24  'qvOzaRqP$ Calcium 8.9 - 10.3 mg/dL 8.8(L) 9.3 9.2    LFT Hepatic Function Latest Ref Rng & Units 11/06/2020 03/27/2020 01/12/2017  Total Protein 6.5 - 8.1 g/dL 6.4(L) 7.1 7.0  Albumin 3.5 - 5.0 g/dL 3.1(L) 3.5 4.2  AST 15 - 41 U/L $Remo'24 20 14  'OBEaE$ ALT 0 - 44 U/L $Remo'23 18 12  'Ycapj$ Alk Phosphatase 38 - 126 U/L 48 65 80  Total Bilirubin 0.3 - 1.2 mg/dL 0.5 0.6 <0.2  Bilirubin, Direct 0.0 - 0.2 mg/dL - <0.1 -     STUDIES: DG Chest Port 1 View  Result Date: 11/06/2020 CLINICAL DATA:  55 year old female with history of gastrointestinal hemorrhage EXAM: PORTABLE CHEST 1 VIEW COMPARISON:  03/27/2020 FINDINGS: Cardiomediastinal silhouette unchanged in size and contour. Low lung volumes. No pneumothorax or pleural effusion. No confluent airspace disease. Coarsened interstitial markings similar to the prior. No displaced fracture IMPRESSION: Low lung volumes without evidence of acute cardiopulmonary disease. Electronically Signed   By: Corrie Mckusick D.O.   On: 11/06/2020 12:50      Impression / Plan:   Jasmine Meadows is a 54 y.o. pleasant African-American female with history of breast reduction surgery, ductal carcinoma in situ currently maintained on tamoxifen who presented with rectal bleeding with symptomatic anemia  Rectal bleeding with anemia, mildly elevated BUN/creatinine ratio Differentials include diverticular bleed or bleeding from hemorrhoids or less likely an upper GI bleed Monitor CBC closely and transfuse to keep hemoglobin greater than 7 Okay with clear liquid diet Check iron panel, B12 and folate levels Patient is COVID-positive but she does not have any upper respiratory symptoms or pulmonary symptoms Recommend upper endoscopy as well as colonoscopy tomorrow Bowel prep ordered  I have discussed alternative options, risks & benefits,  which include, but are not  limited to, bleeding, infection, perforation,respiratory complication & drug reaction.  The patient agrees with this plan & written consent will be obtained.     Thank you for involving me in the care of this patient.      LOS: 0 days   Jasmine Ferrin,  MD  11/06/2020, 1:26 PM   Note: This dictation was prepared with Dragon dictation along with smaller phrase technology. Any transcriptional errors that result from this process are unintentional.

## 2020-11-07 ENCOUNTER — Observation Stay: Payer: BC Managed Care – PPO | Admitting: Certified Registered"

## 2020-11-07 ENCOUNTER — Encounter
Admission: EM | Disposition: A | Discharge status: Home / Self Care | Payer: Self-pay | Source: Home / Self Care | Attending: Emergency Medicine

## 2020-11-07 ENCOUNTER — Encounter: Payer: Self-pay | Admitting: Internal Medicine

## 2020-11-07 DIAGNOSIS — K922 Gastrointestinal hemorrhage, unspecified: Secondary | ICD-10-CM | POA: Diagnosis not present

## 2020-11-07 DIAGNOSIS — K253 Acute gastric ulcer without hemorrhage or perforation: Secondary | ICD-10-CM | POA: Diagnosis not present

## 2020-11-07 DIAGNOSIS — U071 COVID-19: Secondary | ICD-10-CM | POA: Diagnosis not present

## 2020-11-07 DIAGNOSIS — C50919 Malignant neoplasm of unspecified site of unspecified female breast: Secondary | ICD-10-CM | POA: Diagnosis not present

## 2020-11-07 DIAGNOSIS — K625 Hemorrhage of anus and rectum: Secondary | ICD-10-CM | POA: Diagnosis not present

## 2020-11-07 DIAGNOSIS — D62 Acute posthemorrhagic anemia: Secondary | ICD-10-CM | POA: Diagnosis not present

## 2020-11-07 DIAGNOSIS — K5790 Diverticulosis of intestine, part unspecified, without perforation or abscess without bleeding: Secondary | ICD-10-CM | POA: Diagnosis not present

## 2020-11-07 DIAGNOSIS — K259 Gastric ulcer, unspecified as acute or chronic, without hemorrhage or perforation: Secondary | ICD-10-CM | POA: Diagnosis not present

## 2020-11-07 DIAGNOSIS — K921 Melena: Secondary | ICD-10-CM | POA: Diagnosis not present

## 2020-11-07 HISTORY — PX: ESOPHAGOGASTRODUODENOSCOPY (EGD) WITH PROPOFOL: SHX5813

## 2020-11-07 HISTORY — PX: COLONOSCOPY WITH PROPOFOL: SHX5780

## 2020-11-07 LAB — CBC
HCT: 25 % — ABNORMAL LOW (ref 36.0–46.0)
HCT: 22.7 % — ABNORMAL LOW (ref 36.0–46.0)
HCT: 22.8 % — ABNORMAL LOW (ref 36.0–46.0)
HCT: 26.2 % — ABNORMAL LOW (ref 36.0–46.0)
Hemoglobin: 8.1 g/dL — ABNORMAL LOW (ref 12.0–15.0)
Hemoglobin: 7.3 g/dL — ABNORMAL LOW (ref 12.0–15.0)
Hemoglobin: 7.3 g/dL — ABNORMAL LOW (ref 12.0–15.0)
Hemoglobin: 8.8 g/dL — ABNORMAL LOW (ref 12.0–15.0)
MCH: 27.4 pg (ref 26.0–34.0)
MCH: 27.3 pg (ref 26.0–34.0)
MCH: 27.7 pg (ref 26.0–34.0)
MCH: 28.7 pg (ref 26.0–34.0)
MCHC: 32.4 g/dL (ref 30.0–36.0)
MCHC: 32.2 g/dL (ref 30.0–36.0)
MCHC: 32 g/dL (ref 30.0–36.0)
MCHC: 33.6 g/dL (ref 30.0–36.0)
MCV: 84.5 fL (ref 80.0–100.0)
MCV: 85 fL (ref 80.0–100.0)
MCV: 86.4 fL (ref 80.0–100.0)
MCV: 85.3 fL (ref 80.0–100.0)
Platelets: 233 10*3/uL (ref 150–400)
Platelets: 206 10*3/uL (ref 150–400)
Platelets: 205 10*3/uL (ref 150–400)
Platelets: 202 10*3/uL (ref 150–400)
RBC: 2.96 MIL/uL — ABNORMAL LOW (ref 3.87–5.11)
RBC: 2.67 MIL/uL — ABNORMAL LOW (ref 3.87–5.11)
RBC: 2.64 MIL/uL — ABNORMAL LOW (ref 3.87–5.11)
RBC: 3.07 MIL/uL — ABNORMAL LOW (ref 3.87–5.11)
RDW: 14.5 % (ref 11.5–15.5)
RDW: 14.5 % (ref 11.5–15.5)
RDW: 14.6 % (ref 11.5–15.5)
RDW: 14.6 % (ref 11.5–15.5)
WBC: 10 10*3/uL (ref 4.0–10.5)
WBC: 6.8 10*3/uL (ref 4.0–10.5)
WBC: 7 10*3/uL (ref 4.0–10.5)
WBC: 7.1 10*3/uL (ref 4.0–10.5)
nRBC: 0 % (ref 0.0–0.2)
nRBC: 0 % (ref 0.0–0.2)
nRBC: 0.4 % — ABNORMAL HIGH (ref 0.0–0.2)
nRBC: 0.6 % — ABNORMAL HIGH (ref 0.0–0.2)

## 2020-11-07 LAB — VITAMIN B12: Vitamin B-12: 969 pg/mL — ABNORMAL HIGH (ref 180–914)

## 2020-11-07 LAB — ABO/RH: ABO/RH(D): O POS

## 2020-11-07 LAB — GLUCOSE, CAPILLARY
Glucose-Capillary: 93 mg/dL (ref 70–99)
Glucose-Capillary: 77 mg/dL (ref 70–99)

## 2020-11-07 LAB — BASIC METABOLIC PANEL
Anion gap: 6 (ref 5–15)
BUN: 14 mg/dL (ref 6–20)
CO2: 25 mmol/L (ref 22–32)
Calcium: 8.2 mg/dL — ABNORMAL LOW (ref 8.9–10.3)
Chloride: 110 mmol/L (ref 98–111)
Creatinine, Ser: 0.9 mg/dL (ref 0.44–1.00)
GFR, Estimated: >60 mL/min (ref >60)
Glucose, Bld: 132 mg/dL — ABNORMAL HIGH (ref 70–99)
Potassium: 3.7 mmol/L (ref 3.5–5.1)
Sodium: 141 mmol/L (ref 135–145)

## 2020-11-07 LAB — PREPARE RBC (CROSSMATCH)

## 2020-11-07 LAB — HIV ANTIBODY (ROUTINE TESTING W REFLEX): HIV Screen 4th Generation wRfx: NONREACTIVE

## 2020-11-07 SURGERY — ESOPHAGOGASTRODUODENOSCOPY (EGD) WITH PROPOFOL
Anesthesia: General

## 2020-11-07 MED ORDER — GLYCOPYRROLATE 0.2 MG/ML IJ SOLN
INTRAMUSCULAR | Status: DC | PRN
  Administered 2020-11-07: .2 mg via INTRAVENOUS

## 2020-11-07 MED ORDER — PROPOFOL 500 MG/50ML IV EMUL
INTRAVENOUS | Status: DC | PRN
  Administered 2020-11-07: 150 ug/kg/min via INTRAVENOUS

## 2020-11-07 MED ORDER — SODIUM CHLORIDE 0.9 % IV SOLN
510.0000 mg | Freq: Once | INTRAVENOUS | Status: AC
  Administered 2020-11-07: 510 mg via INTRAVENOUS
  Filled 2020-11-07: qty 17

## 2020-11-07 MED ORDER — PROPOFOL 10 MG/ML IV BOLUS
INTRAVENOUS | Status: DC | PRN
  Administered 2020-11-07 (×2): 30 mg via INTRAVENOUS
  Administered 2020-11-07 (×2): 20 mg via INTRAVENOUS

## 2020-11-07 MED ORDER — SODIUM CHLORIDE 0.9% IV SOLUTION: Freq: Once | INTRAVENOUS | Status: AC

## 2020-11-07 MED ORDER — LIDOCAINE HCL (CARDIAC) PF 100 MG/5ML IV SOSY
PREFILLED_SYRINGE | INTRAVENOUS | Status: DC | PRN
  Administered 2020-11-07: 40 mg via INTRAVENOUS

## 2020-11-07 MED ORDER — PHENYLEPHRINE HCL (PRESSORS) 10 MG/ML IV SOLN
INTRAVENOUS | Status: DC | PRN
  Administered 2020-11-07 (×2): 100 ug via INTRAVENOUS

## 2020-11-07 NOTE — Anesthesia Procedure Notes (Signed)
Procedure Name: General with mask airway Performed by: Kelton Pillar, CRNA Pre-anesthesia Checklist: Emergency Drugs available, Patient identified, Suction available and Patient being monitored Patient Re-evaluated:Patient Re-evaluated prior to induction Oxygen Delivery Method: Simple face mask Induction Type: IV induction Placement Confirmation: CO2 detector and positive ETCO2

## 2020-11-07 NOTE — Anesthesia Postprocedure Evaluation (Signed)
Anesthesia Post Note  Patient: Jasmine Meadows  Procedure(s) Performed: ESOPHAGOGASTRODUODENOSCOPY (EGD) WITH PROPOFOL (N/A ) COLONOSCOPY WITH PROPOFOL (N/A )  Patient location during evaluation: PACU Anesthesia Type: General Level of consciousness: awake and alert, awake and oriented Pain management: pain level controlled Vital Signs Assessment: post-procedure vital signs reviewed and stable Respiratory status: spontaneous breathing, nonlabored ventilation and respiratory function stable Cardiovascular status: blood pressure returned to baseline and stable Postop Assessment: no apparent nausea or vomiting Anesthetic complications: no   No complications documented.   Last Vitals:  Vitals:   11/07/20 1305 11/07/20 1327  BP: 100/60 95/75  Pulse: 89 79  Resp: 17 18  Temp:  36.7 C  SpO2: 100% 100%    Last Pain:  Vitals:   11/07/20 1305  TempSrc:   PainSc: 0-No pain                 Phill Mutter

## 2020-11-07 NOTE — Op Note (Signed)
[facility] Patient Name: Jasmine Meadows Procedure Date: 11/07/2020 11:10 AM MRN: [id number] Account #: [id number] Date of Birth: 11-12-1965 Admit Type: Outpatient Age: 55 Room: [facility] ENDO ROOM 4 Gender: Female Note Status: Finalized Procedure:             Upper GI endoscopy Indications:           Acute post hemorrhagic anemia, Hematochezia Providers:             Lin Landsman MD, MD Medicines:             General Anesthesia Complications:         No immediate complications. Estimated blood loss: None. Procedure:             Pre-Anesthesia Assessment:                        - Prior to the procedure, a History and Physical was                         performed, and patient medications and allergies were                         reviewed. The patient is competent. The risks and                         benefits of the procedure and the sedation options and                         risks were discussed with the patient. All questions                         were answered and informed consent was obtained.                         Patient identification and proposed procedure were                         verified by the physician, the nurse, the                         anesthesiologist, the anesthetist and the technician                         in the pre-procedure area in the procedure room in the                         endoscopy suite. Mental Status Examination: alert and                         oriented. Airway Examination: normal oropharyngeal                         airway and neck mobility. Respiratory Examination:                         clear to auscultation. CV Examination: normal.                         Prophylactic Antibiotics: The patient does  not require                         prophylactic antibiotics. Prior Anticoagulants: The                         patient has taken no previous anticoagulant or                         antiplatelet  agents. ASA Grade Assessment: II - A                         patient with mild systemic disease. After reviewing                         the risks and benefits, the patient was deemed in                         satisfactory condition to undergo the procedure. The                         anesthesia plan was to use general anesthesia.                         Immediately prior to administration of medications,                         the patient was re-assessed for adequacy to receive                         sedatives. The heart rate, respiratory rate, oxygen                         saturations, blood pressure, adequacy of pulmonary                         ventilation, and response to care were monitored                         throughout the procedure. The physical status of the                         patient was re-assessed after the procedure.                        After obtaining informed consent, the endoscope was                         passed under direct vision. Throughout the procedure,                         the patient's blood pressure, pulse, and oxygen                         saturations were monitored continuously. The Endoscope                         was introduced through the mouth, and advanced to the  second part of duodenum. The upper GI endoscopy was                         accomplished without difficulty. The patient tolerated                         the procedure well. Findings:      The duodenal bulb and second portion of the duodenum were normal.      Few non-bleeding superficial gastric ulcers with a clean ulcer base       (Forrest Class III) were found in the gastric antrum. The largest lesion       was 5 mm in largest dimension. Biopsies were taken with a cold forceps       for Helicobacter pylori testing.      The gastric fundus, gastric body and incisura were normal. Biopsies were       taken with a cold forceps for Helicobacter pylori  testing.      The cardia and gastric fundus were normal on retroflexion.      Esophagogastric landmarks were identified: the gastroesophageal junction       was found at 35 cm from the incisors.      The gastroesophageal junction and examined esophagus were normal. Impression:            - Normal duodenal bulb and second portion of the                         duodenum.                        - Non-bleeding gastric ulcers with a clean ulcer base                         (Forrest Class III). Biopsied.                        - Normal gastric fundus, gastric body and incisura.                         Biopsied.                        - Esophagogastric landmarks identified.                        - Normal gastroesophageal junction and esophagus. Recommendation:        - Await pathology results.                        - Continue present medications.                        - Use Protonix (pantoprazole) 40 mg PO BID for 1 month.                        - Proceed with colonoscopy as scheduled                        See colonoscopy report Procedure Code(s):     --- Professional ---  87564, Esophagogastroduodenoscopy, flexible,                         transoral; with biopsy, single or multiple Diagnosis Code(s):     --- Professional ---                        K25.9, Gastric ulcer, unspecified as acute or chronic,                         without hemorrhage or perforation                        D62, Acute posthemorrhagic anemia                        K92.1, Melena (includes Hematochezia) CPT copyright 2019 American Medical Association. All rights reserved. The codes documented in this report are preliminary and upon coder review may  be revised to meet current compliance requirements. Dr. Ulyess Mort Lin Landsman MD, MD 11/07/2020 12:23:52 PM This report has been signed electronically. Number of Addenda: 0 Note Initiated On: 11/07/2020 11:10 AM Estimated Blood Loss:   Estimated blood loss: none.      [facility]

## 2020-11-07 NOTE — Transfer of Care (Signed)
Immediate Anesthesia Transfer of Care Note  Patient: Jasmine Meadows  Procedure(s) Performed: ESOPHAGOGASTRODUODENOSCOPY (EGD) WITH PROPOFOL (N/A ) COLONOSCOPY WITH PROPOFOL (N/A )  Patient Location: Endo room after 43min recovery, transporter to return pt to assigned bedroom.  Anesthesia Type:General  Level of Consciousness: awake, alert , oriented and patient cooperative  Airway & Oxygen Therapy: REA, spont breathing, hospital mask on  Post-op Assessment: Report given to RN and Post -op Vital signs reviewed and stable  Post vital signs: Reviewed and stable  Last Vitals:  Vitals Value Taken Time  BP 100/68 11/07/20 1255  Temp    Pulse 94 11/07/20 1255  Resp 16 11/07/20 1255  SpO2 100 % 11/07/20 1255    Last Pain:  Vitals:   11/07/20 1255  TempSrc:   PainSc: 0-No pain         Complications: No complications documented.

## 2020-11-07 NOTE — Anesthesia Preprocedure Evaluation (Addendum)
Anesthesia Evaluation  Patient identified by MRN, date of birth, ID band Patient awake    Reviewed: Allergy & Precautions, H&P , NPO status , Patient's Chart, lab work & pertinent test results  History of Anesthesia Complications (+) PONV and history of anesthetic complications  Airway Mallampati: II  TM Distance: >3 FB Neck ROM: Full    Dental no notable dental hx.    Pulmonary neg pulmonary ROS, neg sleep apnea, neg COPD,    Pulmonary exam normal        Cardiovascular (-) angina(-) Past MI and (-) Cardiac Stents negative cardio ROS Normal cardiovascular exam(-) dysrhythmias      Neuro/Psych negative neurological ROS  negative psych ROS   GI/Hepatic Neg liver ROS, GERD  ,  Endo/Other  negative endocrine ROS  Renal/GU negative Renal ROS  negative genitourinary   Musculoskeletal   Abdominal   Peds  Hematology  (+) Blood dyscrasia (Hgb 8.1), anemia ,   Anesthesia Other Findings Past Medical History: No date: Bilateral hand pain No date: Breast cancer ([place])     Comment:  left - radiation only No date: Carpal tunnel syndrome of left wrist 04/2012: Carpal tunnel syndrome of right wrist No date: Environmental allergies No date: PONV (postoperative nausea and vomiting) No date: Seasonal allergies No date: Uterine fibroid     Comment:  Lupron injection every 3 mos.  Past Surgical History: No date: BREAST REDUCTION SURGERY 03/19/2012: CARPAL TUNNEL RELEASE     Comment:  Procedure: CARPAL TUNNEL RELEASE;  Surgeon: Wynonia Sours, MD;  Location: [place];                Service: Orthopedics;  Laterality: Left; 05/07/2012: CARPAL TUNNEL RELEASE     Comment:  Procedure: CARPAL TUNNEL RELEASE;  Surgeon: Wynonia Sours, MD;  Location: [place];                Service: Orthopedics;  Laterality: Right; 03/31/2014: MASS EXCISION; Left     Comment:  Procedure:  EXCISION CYST ;  Surgeon: Wynonia Sours, MD;                Location: [place];  Service:               Orthopedics;  Laterality: Left;  ANESTHESIA:  IV REGIONAL              FAB No date: MYOMECTOMY 03/31/2014: TRIGGER FINGER RELEASE; Left     Comment:  Procedure: RELEASE A-1 PULLEY LEFT RING FINGER;                Surgeon: Wynonia Sours, MD;  Location: [place];  Service: Orthopedics;  Laterality: Left;  BMI    Body Mass Index: 33.43 kg/m    Covid positive-minimal symptoms   Reproductive/Obstetrics negative OB ROS                            Anesthesia Physical Anesthesia Plan  ASA: II  Anesthesia Plan: General   Post-op Pain Management:    Induction:   PONV Risk Score and Plan: Propofol infusion and TIVA  Airway Management Planned: Nasal Cannula  Additional Equipment:  Intra-op Plan:   Post-operative Plan:   Informed Consent: I have reviewed the patients History and Physical, chart, labs and discussed the procedure including the risks, benefits and alternatives for the proposed anesthesia with the patient or authorized representative who has indicated his/her understanding and acceptance.     Dental Advisory Given  Plan Discussed with: Anesthesiologist, CRNA and Surgeon  Anesthesia Plan Comments:         Anesthesia Quick Evaluation

## 2020-11-07 NOTE — Progress Notes (Signed)
1        [facility] NAME: Jasmine Meadows    MR#:  [id number]  DATE OF BIRTH:  [birth year]  SUBJECTIVE:  CHIEF COMPLAINT:   Chief Complaint  Patient presents with  . Abdominal Pain  . Rectal Bleeding  Feeling fine.  No further bleeding while here.  Waiting for endoscopy. REVIEW OF SYSTEMS:  Review of Systems  Constitutional: Positive for malaise/fatigue. Negative for diaphoresis, fever and weight loss.  HENT: Negative for ear discharge, ear pain, hearing loss, nosebleeds, sore throat and tinnitus.   Eyes: Negative for blurred vision and pain.  Respiratory: Negative for cough, hemoptysis, shortness of breath and wheezing.   Cardiovascular: Negative for chest pain, palpitations, orthopnea and leg swelling.  Gastrointestinal: Positive for blood in stool. Negative for abdominal pain, constipation, diarrhea, heartburn, nausea and vomiting.  Genitourinary: Negative for dysuria, frequency and urgency.  Musculoskeletal: Negative for back pain and myalgias.  Skin: Negative for itching and rash.  Neurological: Negative for dizziness, tingling, tremors, focal weakness, seizures, weakness and headaches.  Psychiatric/Behavioral: Negative for depression. The patient is not nervous/anxious.    DRUG ALLERGIES:   Allergies  Allergen Reactions  . Other Anaphylaxis   VITALS:  Blood pressure 95/75, pulse 79, temperature 98 F (36.7 C), resp. rate 18, height 5\' 2"  (1.575 m), weight 82.9 kg, last menstrual period 02/02/2012, SpO2 100 %. PHYSICAL EXAMINATION:  Physical Exam HENT:     Head: Normocephalic and atraumatic.  Eyes:     Extraocular Movements: EOM normal.     Conjunctiva/sclera: Conjunctivae normal.     Pupils: Pupils are equal, round, and reactive to light.  Neck:     Thyroid: No thyromegaly.     Trachea: No tracheal deviation.  Cardiovascular:     Rate and Rhythm: Normal rate and regular rhythm.     Heart sounds: Normal heart sounds.   Pulmonary:     Effort: Pulmonary effort is normal. No respiratory distress.     Breath sounds: Normal breath sounds. No wheezing.  Chest:     Chest wall: No tenderness.  Abdominal:     General: Bowel sounds are normal. There is no distension.     Palpations: Abdomen is soft.     Tenderness: There is no abdominal tenderness.  Musculoskeletal:        General: Normal range of motion.     Cervical back: Normal range of motion and neck supple.  Skin:    General: Skin is warm and dry.     Findings: No rash.  Neurological:     Mental Status: She is alert and oriented to person, place, and time.     Cranial Nerves: No cranial nerve deficit.    LABORATORY PANEL:  Female CBC Recent Labs  Lab 11/07/20 1039  WBC 6.8  HGB 7.3*  HCT 22.7*  PLT 206   ------------------------------------------------------------------------------------------------------------------ Chemistries  Recent Labs  Lab 11/06/20 0037 11/07/20 0433  NA 139 141  K 3.7 3.7  CL 106 110  CO2 25 25  GLUCOSE 143* 132*  BUN 24* 14  CREATININE 0.81 0.90  CALCIUM 8.8* 8.2*  AST 24  --   ALT 23  --   ALKPHOS 48  --   BILITOT 0.5  --    RADIOLOGY:  No results found. ASSESSMENT AND PLAN:  55 year old female with a known history of breast cancer on tamoxifen status post radiation therapy, uterine fibroid, GERD, carpal tunnel syndrome admitted for rectal bleeding  Rectal  bleeding and acute blood loss anemia Hemoglobin dropped from 13-> 7.3 Will transfuse 1 packed red blood cell Status post EGD and colonoscopy on 1/19 showing nonbleeding gastric ulcer and diverticulosis No further bleeding while here in the hospital Continue PPI twice daily and resume diet per GI Possible discharge tomorrow  Breast cancer Tamoxifen  GERD PPI twice daily  Asymptomatic COVID-19 virus infection No treatment needed  Body mass index is 33.43 kg/m.      Status is: Observation  The patient remains OBS appropriate and  will d/c before 2 midnights.  Dispo: The patient is from: Home              Anticipated d/c is to: Home              Anticipated d/c date is: 1 day              Patient currently is not medically stable to d/c.  Overnight observation for hemodynamics and tolerance of diet.    DVT prophylaxis:       SCDs Start: 11/06/20 2221     Family Communication: discussed with patient   All the records are reviewed and case discussed with Care Management/Social Worker. Management plans discussed with the patient, nursing and they are in agreement.  CODE STATUS: Full Code  TOTAL TIME TAKING CARE OF THIS PATIENT: 35 minutes.   More than 50% of the time was spent in counseling/coordination of care: YES  POSSIBLE D/C IN 1 DAYS, DEPENDING ON CLINICAL CONDITION.   Max Sane M.D on 11/07/2020 at 1:31 PM  [facility]   CC: Primary care physician; Kathyrn Lass, MD  Note: This dictation was prepared with Dragon dictation along with smaller phrase technology. Any transcriptional errors that result from this process are unintentional.

## 2020-11-07 NOTE — Addendum Note (Signed)
Addendum  created 11/07/20 1426 by Kelton Pillar, CRNA   Flowsheet accepted, Intraprocedure Flowsheets edited

## 2020-11-07 NOTE — Op Note (Signed)
[facility] Patient Name: Jasmine Meadows Procedure Date: 11/07/2020 11:10 AM MRN: [id number] Account #: [id number] Date of Birth: 02/15/1966 Admit Type: Outpatient Age: 55 Room: [facility] ENDO ROOM 4 Gender: Female Note Status: Finalized Procedure:             Colonoscopy Indications:           Hematochezia, Acute post hemorrhagic anemia Providers:             Lin Landsman MD, MD Medicines:             General Anesthesia Complications:         No immediate complications. Estimated blood loss: None. Procedure:             Pre-Anesthesia Assessment:                        - Prior to the procedure, a History and Physical was                         performed, and patient medications and allergies were                         reviewed. The patient is competent. The risks and                         benefits of the procedure and the sedation options and                         risks were discussed with the patient. All questions                         were answered and informed consent was obtained.                         Patient identification and proposed procedure were                         verified by the physician, the nurse, the                         anesthesiologist, the anesthetist and the technician                         in the pre-procedure area in the procedure room in the                         endoscopy suite. Mental Status Examination: alert and                         oriented. Airway Examination: normal oropharyngeal                         airway and neck mobility. Respiratory Examination:                         clear to auscultation. CV Examination: normal.                         Prophylactic Antibiotics: The patient does not require  prophylactic antibiotics. Prior Anticoagulants: The                         patient has taken no previous anticoagulant or                         antiplatelet agents.  ASA Grade Assessment: II - A                         patient with mild systemic disease. After reviewing                         the risks and benefits, the patient was deemed in                         satisfactory condition to undergo the procedure. The                         anesthesia plan was to use general anesthesia.                         Immediately prior to administration of medications,                         the patient was re-assessed for adequacy to receive                         sedatives. The heart rate, respiratory rate, oxygen                         saturations, blood pressure, adequacy of pulmonary                         ventilation, and response to care were monitored                         throughout the procedure. The physical status of the                         patient was re-assessed after the procedure.                        After obtaining informed consent, the colonoscope was                         passed under direct vision. Throughout the procedure,                         the patient's blood pressure, pulse, and oxygen                         saturations were monitored continuously. The                         Colonoscope was introduced through the anus and                         advanced to the the terminal ileum, with  identification of the appendiceal orifice and IC                         valve. The colonoscopy was performed without                         difficulty. The patient tolerated the procedure well.                         The quality of the bowel preparation was poor. Findings:      The perianal and digital rectal examinations were normal. Pertinent       negatives include normal sphincter tone and no palpable rectal lesions.      The terminal ileum appeared normal.      Scattered diverticula were found in the sigmoid colon and descending       colon.      The retroflexed view of the distal rectum and anal  verge was normal and       showed no anal or rectal abnormalities.      Copious quantities of brown semi-liquid stool was found in the entire       colon, precluding visualization. Impression:            - Preparation of the colon was poor.                        - The examined portion of the ileum was normal.                        - Diverticulosis in the sigmoid colon and in the                         descending colon.                        - The distal rectum and anal verge are normal on                         retroflexion view.                        - Stool in the entire examined colon.                        - No specimens collected. Recommendation:        - Return patient to hospital ward for ongoing care.                        - Resume regular diet today.                        - Continue present medications.                        - Return to GI clinic in 1 month. Procedure Code(s):     --- Professional ---                        [phone], Colonoscopy, flexible; diagnostic, including  collection of specimen(s) by brushing or washing, when                         performed (separate procedure) Diagnosis Code(s):     --- Professional ---                        K92.1, Melena (includes Hematochezia)                        D62, Acute posthemorrhagic anemia                        K57.30, Diverticulosis of large intestine without                         perforation or abscess without bleeding CPT copyright 2019 American Medical Association. All rights reserved. The codes documented in this report are preliminary and upon coder review may  be revised to meet current compliance requirements. Dr. Ulyess Mort Lin Landsman MD, MD 11/07/2020 12:37:30 PM This report has been signed electronically. Number of Addenda: 0 Note Initiated On: 11/07/2020 11:10 AM Scope Withdrawal Time: 0 hours 5 minutes 23 seconds  Total Procedure Duration: 0 hours 9 minutes 11  seconds  Estimated Blood Loss:  Estimated blood loss: none.      [facility]

## 2020-11-08 ENCOUNTER — Encounter: Payer: Self-pay | Admitting: Gastroenterology

## 2020-11-08 DIAGNOSIS — D62 Acute posthemorrhagic anemia: Secondary | ICD-10-CM | POA: Diagnosis not present

## 2020-11-08 DIAGNOSIS — K922 Gastrointestinal hemorrhage, unspecified: Secondary | ICD-10-CM

## 2020-11-08 DIAGNOSIS — K625 Hemorrhage of anus and rectum: Secondary | ICD-10-CM | POA: Diagnosis not present

## 2020-11-08 DIAGNOSIS — U071 COVID-19: Secondary | ICD-10-CM | POA: Diagnosis not present

## 2020-11-08 LAB — CBC
HCT: 25.4 % — ABNORMAL LOW (ref 36.0–46.0)
Hemoglobin: 8.2 g/dL — ABNORMAL LOW (ref 12.0–15.0)
MCH: 28.2 pg (ref 26.0–34.0)
MCHC: 32.3 g/dL (ref 30.0–36.0)
MCV: 87.3 fL (ref 80.0–100.0)
Platelets: 185 10*3/uL (ref 150–400)
RBC: 2.91 MIL/uL — ABNORMAL LOW (ref 3.87–5.11)
RDW: 14.6 % (ref 11.5–15.5)
WBC: 6.6 10*3/uL (ref 4.0–10.5)
nRBC: 0.5 % — ABNORMAL HIGH (ref 0.0–0.2)

## 2020-11-08 LAB — TYPE AND SCREEN
ABO/RH(D): O POS
Antibody Screen: NEGATIVE
Unit division: 0

## 2020-11-08 LAB — BPAM RBC
Blood Product Expiration Date: 202202182359
ISSUE DATE / TIME: 202201191613
Unit Type and Rh: 5100

## 2020-11-08 LAB — GLUCOSE, CAPILLARY: Glucose-Capillary: 76 mg/dL (ref 70–99)

## 2020-11-08 MED ORDER — PANTOPRAZOLE SODIUM 40 MG PO TBEC: 40.0000 mg | DELAYED_RELEASE_TABLET | Freq: Two times a day (BID) | ORAL | 0 refills | Status: DC

## 2020-11-08 NOTE — Discharge Instructions (Signed)
Peptic Ulcer  A peptic ulcer is a painful sore in the lining of your stomach or the first part of your small intestine. What are the causes? Common causes of this condition include:  An infection.  Using certain pain medicines too often or too much. What increases the risk? You are more likely to get this condition if you:  Smoke.  Have a family history of ulcer disease.  Drink alcohol.  Have been hospitalized in an intensive care unit (ICU). What are the signs or symptoms? Symptoms include:  Burning pain in the area between the chest and the belly button. The pain may: ? Not go away (be persistent). ? Be worse when your stomach is empty. ? Be worse at night.  Heartburn.  Feeling sick to your stomach (nauseous) and throwing up (vomiting).  Bloating. If the ulcer results in bleeding, it can cause you to:  Have poop (stool) that is black and looks like tar.  Throw up bright red blood.  Throw up material that looks like coffee grounds. How is this treated? Treatment for this condition may include:  Stopping things that can cause the ulcer, such as: ? Smoking. ? Using pain medicines.  Medicines to reduce stomach acid.  Antibiotic medicines if the ulcer is caused by an infection.  A procedure that is done using a small, flexible tube that has a camera at the end (upper endoscopy). This may be done if you have a bleeding ulcer.  Surgery. This may be needed if: ? You have a lot of bleeding. ? The ulcer caused a hole somewhere in the digestive system. Follow these instructions at home:  Do not drink alcohol if your doctor tells you not to drink.  Limit how much caffeine you take in.  Do not use any products that contain nicotine or tobacco, such as cigarettes, e-cigarettes, and chewing tobacco. If you need help quitting, ask your doctor.  Take over-the-counter and prescription medicines only as told by your doctor. ? Do not stop or change your medicines unless  you talk with your doctor about it first. ? Do not take aspirin, ibuprofen, or other NSAIDs unless your doctor told you to do so.  Keep all follow-up visits as told by your doctor. This is important. Contact a doctor if:  You do not get better in 7 days after you start treatment.  You keep having an upset stomach (indigestion) or heartburn. Get help right away if:  You have sudden, sharp pain in your belly (abdomen).  You have belly pain that does not go away.  You have bloody poop (stool) or black, tarry poop.  You throw up blood. It may look like coffee grounds.  You feel light-headed or feel like you may pass out (faint).  You get weak.  You get sweaty or feel sticky and cold to the touch (clammy). Summary  Symptoms of a peptic ulcer include burning pain in the area between the chest and the belly button.  Take medicines only as told by your doctor.  Limit how much alcohol and caffeine you have.  Keep all follow-up visits as told by your doctor. This information is not intended to replace advice given to you by your health care provider. Make sure you discuss any questions you have with your health care provider. Document Revised: 04/13/2018 Document Reviewed: 04/13/2018 Elsevier Patient Education  2021 [employer].

## 2020-11-10 NOTE — Discharge Summary (Signed)
[facility] NAME: Jasmine Meadows    MR#:  [id number]  DATE OF BIRTH:  July 03, 1966  DATE OF ADMISSION:  11/06/2020   ADMITTING PHYSICIAN: Ivor Costa, MD  DATE OF DISCHARGE: 11/08/2020 10:46 AM  PRIMARY CARE PHYSICIAN: Kathyrn Lass, MD   ADMISSION DIAGNOSIS:  Rectal bleeding [K62.5] Acute GI bleeding [K92.2] COVID-19 virus infection [U07.1] DISCHARGE DIAGNOSIS:  Principal Problem:   Rectal bleeding Active Problems:   Breast cancer ([place])   GERD (gastroesophageal reflux disease)   Acute blood loss anemia   COVID-19 virus infection   Acute GI bleeding  SECONDARY DIAGNOSIS:   Past Medical History:  Diagnosis Date  . Bilateral hand pain   . Breast cancer ([place])    left - radiation only  . Carpal tunnel syndrome of left wrist   . Carpal tunnel syndrome of right wrist 04/2012  . Environmental allergies   . PONV (postoperative nausea and vomiting)   . Seasonal allergies   . Uterine fibroid    Lupron injection every 3 mos.   HOSPITAL COURSE:  55 year old female with a known history of breast cancer on tamoxifen status post radiation therapy, uterine fibroid, GERD, carpal tunnel syndrome admitted for rectal bleeding  Rectal bleeding and acute blood loss anemia Hemoglobin dropped from 13->8.2 Status post 1 packed RBC transfusion Status post EGD and colonoscopy on 1/19 showing nonbleeding gastric ulcer and diverticulosis No further bleeding while here in the hospital Continue PPI twice daily at discharge and outpatient follow-up with GI  Breast cancer Tamoxifen  GERD PPI twice daily  Asymptomatic COVID-19 virus infection No treatment needed  Body mass index is 33.43 kg/m.   DISCHARGE CONDITIONS:  Stable CONSULTS OBTAINED:  Treatment Team:  Lin Landsman, MD DRUG ALLERGIES:   Allergies  Allergen Reactions  . Other Anaphylaxis   DISCHARGE MEDICATIONS:   Allergies as of 11/08/2020      Reactions   Other  Anaphylaxis      Medication List    STOP taking these medications   famotidine 20 MG tablet Commonly known as: PEPCID   HYDROcodone-acetaminophen 5-325 MG tablet Commonly known as: Norco     TAKE these medications   aspirin EC 81 MG tablet Take 81 mg by mouth daily.   calcium carbonate 600 MG Tabs tablet Commonly known as: OS-CAL Take 600 mg by mouth 2 (two) times daily with a meal.   cetirizine 5 MG tablet Commonly known as: ZYRTEC Take 5 mg by mouth daily.   Multi-Vitamins Tabs Take 1 tablet by mouth daily.   pantoprazole 40 MG tablet Commonly known as: Protonix Take 1 tablet (40 mg total) by mouth 2 (two) times daily before a meal.   tamoxifen 10 MG tablet Commonly known as: NOLVADEX Take 10 mg by mouth daily.      DISCHARGE INSTRUCTIONS:   DIET:  Regular diet DISCHARGE CONDITION:  Good ACTIVITY:  Activity as tolerated OXYGEN:  Home Oxygen: No.  Oxygen Delivery: room air DISCHARGE LOCATION:  home   If you experience worsening of your admission symptoms, develop shortness of breath, life threatening emergency, suicidal or homicidal thoughts you must seek medical attention immediately by calling 911 or calling your MD immediately  if symptoms less severe.  You Must read complete instructions/literature along with all the possible adverse reactions/side effects for all the Medicines you take and that have been prescribed to you. Take any new Medicines after you have completely understood and  accpet all the possible adverse reactions/side effects.   Please note  You were cared for by a hospitalist during your hospital stay. If you have any questions about your discharge medications or the care you received while you were in the hospital after you are discharged, you can call the unit and asked to speak with the hospitalist on call if the hospitalist that took care of you is not available. Once you are discharged, your primary care physician will handle any  further medical issues. Please note that NO REFILLS for any discharge medications will be authorized once you are discharged, as it is imperative that you return to your primary care physician (or establish a relationship with a primary care physician if you do not have one) for your aftercare needs so that they can reassess your need for medications and monitor your lab values.    On the day of Discharge:  VITAL SIGNS:  Blood pressure 104/74, pulse 74, temperature 97.7 F (36.5 C), temperature source Oral, resp. rate 18, height 5\' 2"  (1.575 m), weight 82.9 kg, last menstrual period 02/02/2012, SpO2 97 %. PHYSICAL EXAMINATION:  GENERAL:  55 y.o.-year-old patient lying in the bed with no acute distress.  EYES: Pupils equal, round, reactive to light and accommodation. No scleral icterus. Extraocular muscles intact.  HEENT: Head atraumatic, normocephalic. Oropharynx and nasopharynx clear.  NECK:  Supple, no jugular venous distention. No thyroid enlargement, no tenderness.  LUNGS: Normal breath sounds bilaterally, no wheezing, rales,rhonchi or crepitation. No use of accessory muscles of respiration.  CARDIOVASCULAR: S1, S2 normal. No murmurs, rubs, or gallops.  ABDOMEN: Soft, non-tender, non-distended. Bowel sounds present. No organomegaly or mass.  EXTREMITIES: No pedal edema, cyanosis, or clubbing.  NEUROLOGIC: Cranial nerves II through XII are intact. Muscle strength 5/5 in all extremities. Sensation intact. Gait not checked.  PSYCHIATRIC: The patient is alert and oriented x 3.  SKIN: No obvious rash, lesion, or ulcer.  DATA REVIEW:   CBC Recent Labs  Lab 11/08/20 0417  WBC 6.6  HGB 8.2*  HCT 25.4*  PLT 185    Chemistries  Recent Labs  Lab 11/06/20 0037 11/07/20 0433  NA 139 141  K 3.7 3.7  CL 106 110  CO2 25 25  GLUCOSE 143* 132*  BUN 24* 14  CREATININE 0.81 0.90  CALCIUM 8.8* 8.2*  AST 24  --   ALT 23  --   ALKPHOS 48  --   BILITOT 0.5  --      Outpatient  follow-up  Follow-up Information    Kathyrn Lass, MD. Go on 11/23/2020.   Specialty: Family Medicine Why: at 12:30pm Contact information: [place] [address] [phone]        Lin Landsman, MD. Go on 12/19/2020.   Specialty: Gastroenterology Why: at 2:45pm Contact information: [place] [address] [phone]                  Management plans discussed with the patient, family and they are in agreement.  CODE STATUS: Prior   TOTAL TIME TAKING CARE OF THIS PATIENT: 45 minutes.    Max Sane M.D on 11/10/2020 at 2:49 PM  [facility]   CC: Primary care physician; Kathyrn Lass, MD   Note: This dictation was prepared with Dragon dictation along with smaller phrase technology. Any transcriptional errors that result from this process are unintentional.

## 2020-11-12 LAB — SURGICAL PATHOLOGY

## 2020-11-13 ENCOUNTER — Telehealth: Payer: Self-pay

## 2020-11-13 DIAGNOSIS — A048 Other specified bacterial intestinal infections: Secondary | ICD-10-CM

## 2020-11-13 MED ORDER — OMEPRAZOLE 20 MG PO CPDR: 20.0000 mg | DELAYED_RELEASE_CAPSULE | Freq: Two times a day (BID) | ORAL | 0 refills | Status: DC

## 2020-11-13 MED ORDER — AMOXICILLIN 500 MG PO TABS: 1000.0000 mg | ORAL_TABLET | Freq: Two times a day (BID) | ORAL | 0 refills | Status: AC

## 2020-11-13 MED ORDER — CLARITHROMYCIN 500 MG PO TABS
500.0000 mg | ORAL_TABLET | Freq: Two times a day (BID) | ORAL | 0 refills | Status: AC
Start: 2020-11-13 — End: 2020-11-27

## 2020-11-13 NOTE — Telephone Encounter (Signed)
-----   Message from Lin Landsman, MD sent at 11/13/2020  9:50 AM EST ----- Plz send her the prescription for triple therapy to treat H Pylori for 14days  Omeprazole 20mg  BID Clarithromycin 500mg  BID Amoxicillin 1gm BID  Order H Pylori breath test in 4weeks after completing medication to confirm eradication. She should be off prilosec and H2 blocker atleast for 2weeks before the test  Thanks RV

## 2020-11-13 NOTE — Telephone Encounter (Signed)
Patient verbalized understanding of results and instructions. Sent medication to pharmacy and order H pylori for 4 weeks

## 2020-11-15 DIAGNOSIS — K922 Gastrointestinal hemorrhage, unspecified: Secondary | ICD-10-CM | POA: Diagnosis not present

## 2020-11-30 DIAGNOSIS — D649 Anemia, unspecified: Secondary | ICD-10-CM | POA: Diagnosis not present

## 2020-12-13 DIAGNOSIS — Z08 Encounter for follow-up examination after completed treatment for malignant neoplasm: Secondary | ICD-10-CM | POA: Diagnosis not present

## 2020-12-13 DIAGNOSIS — N6082 Other benign mammary dysplasias of left breast: Secondary | ICD-10-CM | POA: Diagnosis not present

## 2020-12-13 DIAGNOSIS — Z79810 Long term (current) use of selective estrogen receptor modulators (SERMs): Secondary | ICD-10-CM | POA: Diagnosis not present

## 2020-12-13 DIAGNOSIS — D0512 Intraductal carcinoma in situ of left breast: Secondary | ICD-10-CM | POA: Diagnosis not present

## 2020-12-19 ENCOUNTER — Ambulatory Visit: Payer: BC Managed Care – PPO | Admitting: Gastroenterology

## 2020-12-20 ENCOUNTER — Encounter: Payer: Self-pay | Admitting: Gastroenterology

## 2020-12-20 ENCOUNTER — Other Ambulatory Visit: Payer: Self-pay

## 2020-12-20 ENCOUNTER — Ambulatory Visit: Payer: BC Managed Care – PPO | Admitting: Gastroenterology

## 2020-12-20 VITALS — BP 150/87 | HR 116 | Temp 98.5°F | Ht 62.0 in | Wt 188.1 lb

## 2020-12-20 DIAGNOSIS — K279 Peptic ulcer, site unspecified, unspecified as acute or chronic, without hemorrhage or perforation: Secondary | ICD-10-CM

## 2020-12-20 DIAGNOSIS — K5730 Diverticulosis of large intestine without perforation or abscess without bleeding: Secondary | ICD-10-CM | POA: Diagnosis not present

## 2020-12-20 DIAGNOSIS — K625 Hemorrhage of anus and rectum: Secondary | ICD-10-CM | POA: Diagnosis not present

## 2020-12-20 DIAGNOSIS — K2970 Gastritis, unspecified, without bleeding: Secondary | ICD-10-CM

## 2020-12-20 DIAGNOSIS — B9681 Helicobacter pylori [H. pylori] as the cause of diseases classified elsewhere: Secondary | ICD-10-CM

## 2020-12-20 NOTE — Progress Notes (Signed)
Cephas Darby, MD [address]  [place]  [place], [place] [address]  Main: [phone]  Fax: [phone]    Gastroenterology Consultation  Referring Provider:     Kathyrn Lass, MD Primary Care Physician:  Kathyrn Lass, MD Primary Gastroenterologist:  Dr. Cephas Darby Reason for Consultation:     Hospital follow-up        HPI:   Jasmine Meadows is a 55 y.o. female referred by Dr. Kathyrn Lass, MD  for consultation & management of iron deficiency anemia secondary to acute GI blood loss.  She presented to [facility] on 11/06/2020 secondary to rectal bleeding and severe iron deficiency anemia.  She underwent upper endoscopy as well as colonoscopy.  Upper endoscopy revealed small gastric ulcer, clean-based secondary to H. pylori infection based on gastric biopsies.  Colonoscopy revealed left-sided diverticulosis only.  She was treated with triple therapy for 2 weeks.  Patient is here for follow-up since discharge.  Her recent hemoglobin about a month ago was normal.  Ferritin levels are low.  She is currently on oral iron.  She denies any further episodes of rectal bleeding.  She finished triple therapy.  She is currently not on any PPI.  She denies any GI symptoms today.  NSAIDs: None  Antiplts/Anticoagulants/Anti thrombotics: None  GI Procedures: Screening colonoscopy at age 23, reportedly normal EGD and colonoscopy 11/07/2020 - Normal duodenal bulb and second portion of the duodenum. - Non-bleeding gastric ulcers with a clean ulcer base ([place] Class III). Biopsied. - Normal gastric fundus, gastric body and incisura. Biopsied.  DIAGNOSIS:  A. STOMACH, RANDOM; COLD BIOPSY:  - CHRONIC ACTIVE HELICOBACTER PYLORI GASTRITIS.  - NEGATIVE FOR INTESTINAL METAPLASIA, DYSPLASIA, AND MALIGNANCY.   - Preparation of the colon was poor. - The examined portion of the ileum was normal. - Diverticulosis in the sigmoid colon and in the descending colon. - The distal rectum and anal  verge are normal on retroflexion view. - Stool in the entire examined colon.   Past Medical History:  Diagnosis Date  . Bilateral hand pain   . Breast cancer ([place])    left - radiation only  . Carpal tunnel syndrome of left wrist   . Carpal tunnel syndrome of right wrist 04/2012  . Environmental allergies   . PONV (postoperative nausea and vomiting)   . Seasonal allergies   . Uterine fibroid    Lupron injection every 3 mos.    Past Surgical History:  Procedure Laterality Date  . BREAST REDUCTION SURGERY    . CARPAL TUNNEL RELEASE  03/19/2012   Procedure: CARPAL TUNNEL RELEASE;  Surgeon: Wynonia Sours, MD;  Location: [facility];  Service: Orthopedics;  Laterality: Left;  . CARPAL TUNNEL RELEASE  05/07/2012   Procedure: CARPAL TUNNEL RELEASE;  Surgeon: Wynonia Sours, MD;  Location: [facility];  Service: Orthopedics;  Laterality: Right;  . COLONOSCOPY WITH PROPOFOL N/A 11/07/2020   Procedure: COLONOSCOPY WITH PROPOFOL;  Surgeon: Lin Landsman, MD;  Location: [facility] ENDOSCOPY;  Service: Gastroenterology;  Laterality: N/A;  . ESOPHAGOGASTRODUODENOSCOPY (EGD) WITH PROPOFOL N/A 11/07/2020   Procedure: ESOPHAGOGASTRODUODENOSCOPY (EGD) WITH PROPOFOL;  Surgeon: Lin Landsman, MD;  Location: [facility] ENDOSCOPY;  Service: Gastroenterology;  Laterality: N/A;  . MASS EXCISION Left 03/31/2014   Procedure: EXCISION CYST ;  Surgeon: Wynonia Sours, MD;  Location: [facility];  Service: Orthopedics;  Laterality: Left;  ANESTHESIA:  IV REGIONAL FAB  . MYOMECTOMY    . TRIGGER FINGER  RELEASE Left 03/31/2014   Procedure: RELEASE A-1 PULLEY LEFT RING FINGER;  Surgeon: Wynonia Sours, MD;  Location: [facility];  Service: Orthopedics;  Laterality: Left;    Current Outpatient Medications:  .  aspirin EC 81 MG tablet, Take 81 mg by mouth daily., Disp: , Rfl:  .  calcium carbonate (OS-CAL) 600 MG TABS, Take 600 mg by mouth 2 (two) times daily with a meal.,  Disp: , Rfl:  .  cetirizine (ZYRTEC) 10 MG tablet, 1 tablet, Disp: , Rfl:  .  Multiple Vitamin (MULTI-VITAMINS) TABS, Take 1 tablet by mouth daily., Disp: , Rfl:    Family History  Problem Relation Age of Onset  . Asthma Other   . Hypertension Other   . Transient ischemic attack Mother   . Hypertension Mother   . Asthma Mother   . Dementia Father      Social History   Tobacco Use  . Smoking status: Never Smoker  . Smokeless tobacco: Never Used  Substance Use Topics  . Alcohol use: No  . Drug use: No    Allergies as of 12/20/2020 - Review Complete 12/20/2020  Allergen Reaction Noted  . Other Anaphylaxis 03/19/2016    Review of Systems:    All systems reviewed and negative except where noted in HPI.   Physical Exam:  BP (!) 150/87 (BP Location: Left Arm, Patient Position: Sitting, Cuff Size: Normal)   Pulse (!) 116   Temp 98.5 F (36.9 C) (Oral)   Ht 5\' 2"  (1.575 m)   Wt 188 lb 2 oz (85.3 kg)   LMP 02/02/2012   BMI 34.41 kg/m  Patient's last menstrual period was 02/02/2012.  General:   Alert,  Well-developed, well-nourished, pleasant and cooperative in NAD Head:  Normocephalic and atraumatic. Eyes:  Sclera clear, no icterus.   Conjunctiva pink. Ears:  Normal auditory acuity. Nose:  No deformity, discharge, or lesions. Mouth:  No deformity or lesions,oropharynx pink & moist. Neck:  Supple; no masses or thyromegaly. Lungs:  Respirations even and unlabored.  Clear throughout to auscultation.   No wheezes, crackles, or rhonchi. No acute distress. Heart:  Regular rate and rhythm; no murmurs, clicks, rubs, or gallops. Abdomen:  Normal bowel sounds. Soft, non-tender and non-distended without masses, hepatosplenomegaly or hernias noted.  No guarding or rebound tenderness.   Rectal: Not performed Msk:  Symmetrical without gross deformities. Good, equal movement & strength bilaterally. Pulses:  Normal pulses noted. Extremities:  No clubbing or edema.  No  cyanosis. Neurologic:  Alert and oriented x3;  grossly normal neurologically. Skin:  Intact without significant lesions or rashes. No jaundice. Psych:  Alert and cooperative. Normal mood and affect.  Imaging Studies: No recent abdominal imaging  Assessment and Plan:   Jasmine Meadows is a 55 y.o. 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  She also had H. pylori induced gastric ulcer, also contributing to iron deficiency anemia  Peptic ulcer disease, H. pylori infection s/p triple therapy Perform H. pylori breath test in 3 to 4 weeks and treat with pylori if positive  Iron deficiency anemia Currently resolved  Rectal bleeding Likely secondary to left-sided diverticulosis I do not recommend any further work-up at this time   Follow up as needed   Cephas Darby, MD

## 2021-02-25 DIAGNOSIS — D0512 Intraductal carcinoma in situ of left breast: Secondary | ICD-10-CM | POA: Diagnosis not present

## 2021-02-25 DIAGNOSIS — Z9889 Other specified postprocedural states: Secondary | ICD-10-CM | POA: Diagnosis not present

## 2021-02-25 DIAGNOSIS — Z853 Personal history of malignant neoplasm of breast: Secondary | ICD-10-CM | POA: Diagnosis not present

## 2021-02-25 DIAGNOSIS — Z08 Encounter for follow-up examination after completed treatment for malignant neoplasm: Secondary | ICD-10-CM | POA: Diagnosis not present

## 2021-03-30 DIAGNOSIS — R059 Cough, unspecified: Secondary | ICD-10-CM | POA: Diagnosis not present

## 2021-03-30 DIAGNOSIS — J00 Acute nasopharyngitis [common cold]: Secondary | ICD-10-CM | POA: Diagnosis not present

## 2021-03-30 DIAGNOSIS — R0981 Nasal congestion: Secondary | ICD-10-CM | POA: Diagnosis not present

## 2021-04-01 DIAGNOSIS — Z01419 Encounter for gynecological examination (general) (routine) without abnormal findings: Secondary | ICD-10-CM | POA: Diagnosis not present

## 2021-04-01 DIAGNOSIS — R87610 Atypical squamous cells of undetermined significance on cytologic smear of cervix (ASC-US): Secondary | ICD-10-CM | POA: Diagnosis not present

## 2021-04-01 DIAGNOSIS — C50912 Malignant neoplasm of unspecified site of left female breast: Secondary | ICD-10-CM | POA: Diagnosis not present

## 2021-04-01 DIAGNOSIS — D251 Intramural leiomyoma of uterus: Secondary | ICD-10-CM | POA: Diagnosis not present

## 2021-04-17 DIAGNOSIS — Z87898 Personal history of other specified conditions: Secondary | ICD-10-CM | POA: Diagnosis not present

## 2021-04-17 DIAGNOSIS — Z13220 Encounter for screening for lipoid disorders: Secondary | ICD-10-CM | POA: Diagnosis not present

## 2021-04-17 DIAGNOSIS — Z0000 Encounter for general adult medical examination without abnormal findings: Secondary | ICD-10-CM | POA: Diagnosis not present

## 2021-04-17 DIAGNOSIS — E669 Obesity, unspecified: Secondary | ICD-10-CM | POA: Diagnosis not present

## 2021-04-24 DIAGNOSIS — R87610 Atypical squamous cells of undetermined significance on cytologic smear of cervix (ASC-US): Secondary | ICD-10-CM | POA: Diagnosis not present

## 2021-04-24 DIAGNOSIS — N72 Inflammatory disease of cervix uteri: Secondary | ICD-10-CM | POA: Diagnosis not present

## 2021-04-24 DIAGNOSIS — N950 Postmenopausal bleeding: Secondary | ICD-10-CM | POA: Diagnosis not present

## 2021-04-24 DIAGNOSIS — N870 Mild cervical dysplasia: Secondary | ICD-10-CM | POA: Diagnosis not present

## 2021-04-27 DIAGNOSIS — Z03818 Encounter for observation for suspected exposure to other biological agents ruled out: Secondary | ICD-10-CM | POA: Diagnosis not present

## 2021-05-15 DIAGNOSIS — N950 Postmenopausal bleeding: Secondary | ICD-10-CM | POA: Diagnosis not present

## 2021-05-15 DIAGNOSIS — D251 Intramural leiomyoma of uterus: Secondary | ICD-10-CM | POA: Diagnosis not present

## 2021-06-07 DIAGNOSIS — M545 Low back pain, unspecified: Secondary | ICD-10-CM | POA: Diagnosis not present

## 2021-06-09 IMAGING — CR DG CHEST 2V
2 series · 2 of 2 positions shown · non-contrast
Comparison: None.

CLINICAL DATA: Left superior chest pain with persistent belching

EXAM:
CHEST - 2 VIEW

[chest pa]
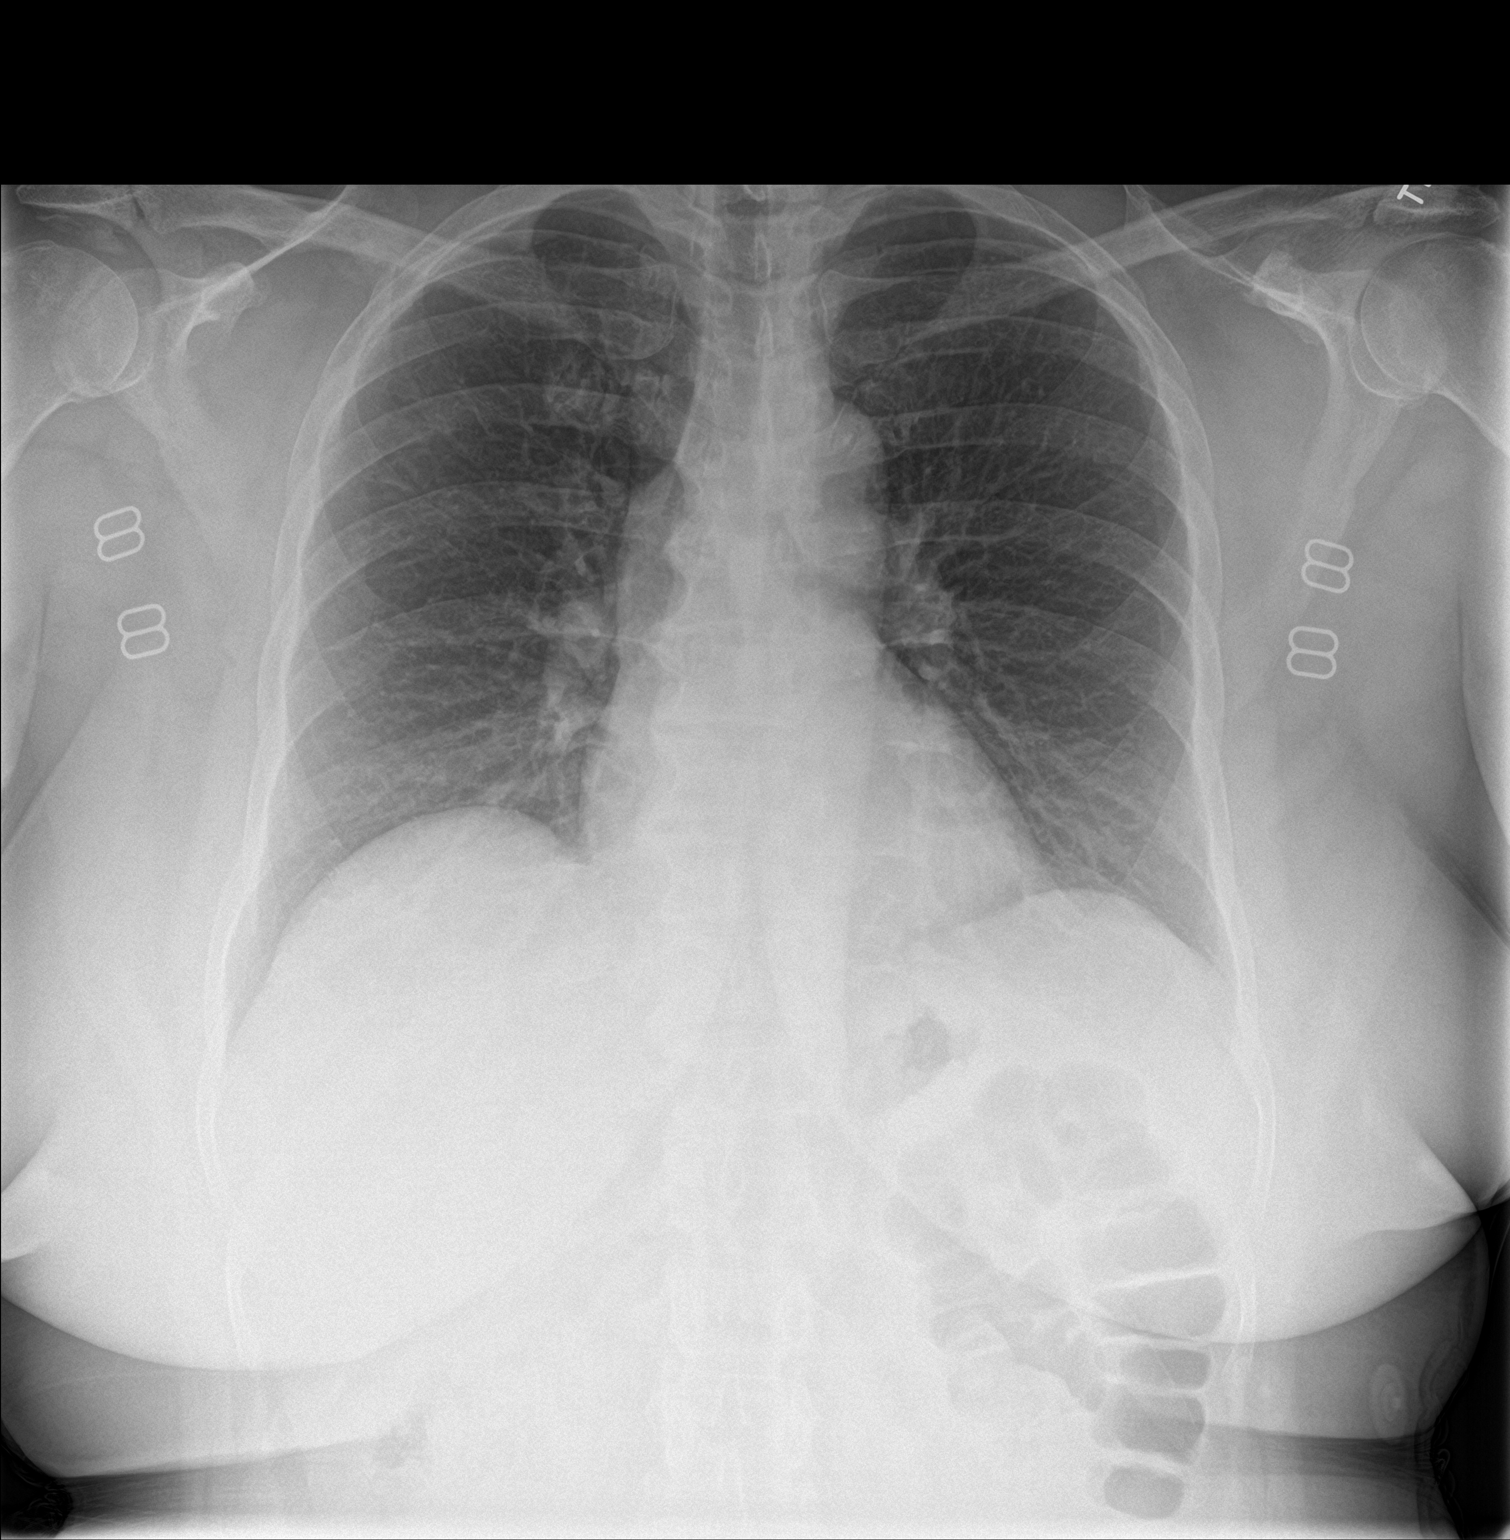

[chest lat]
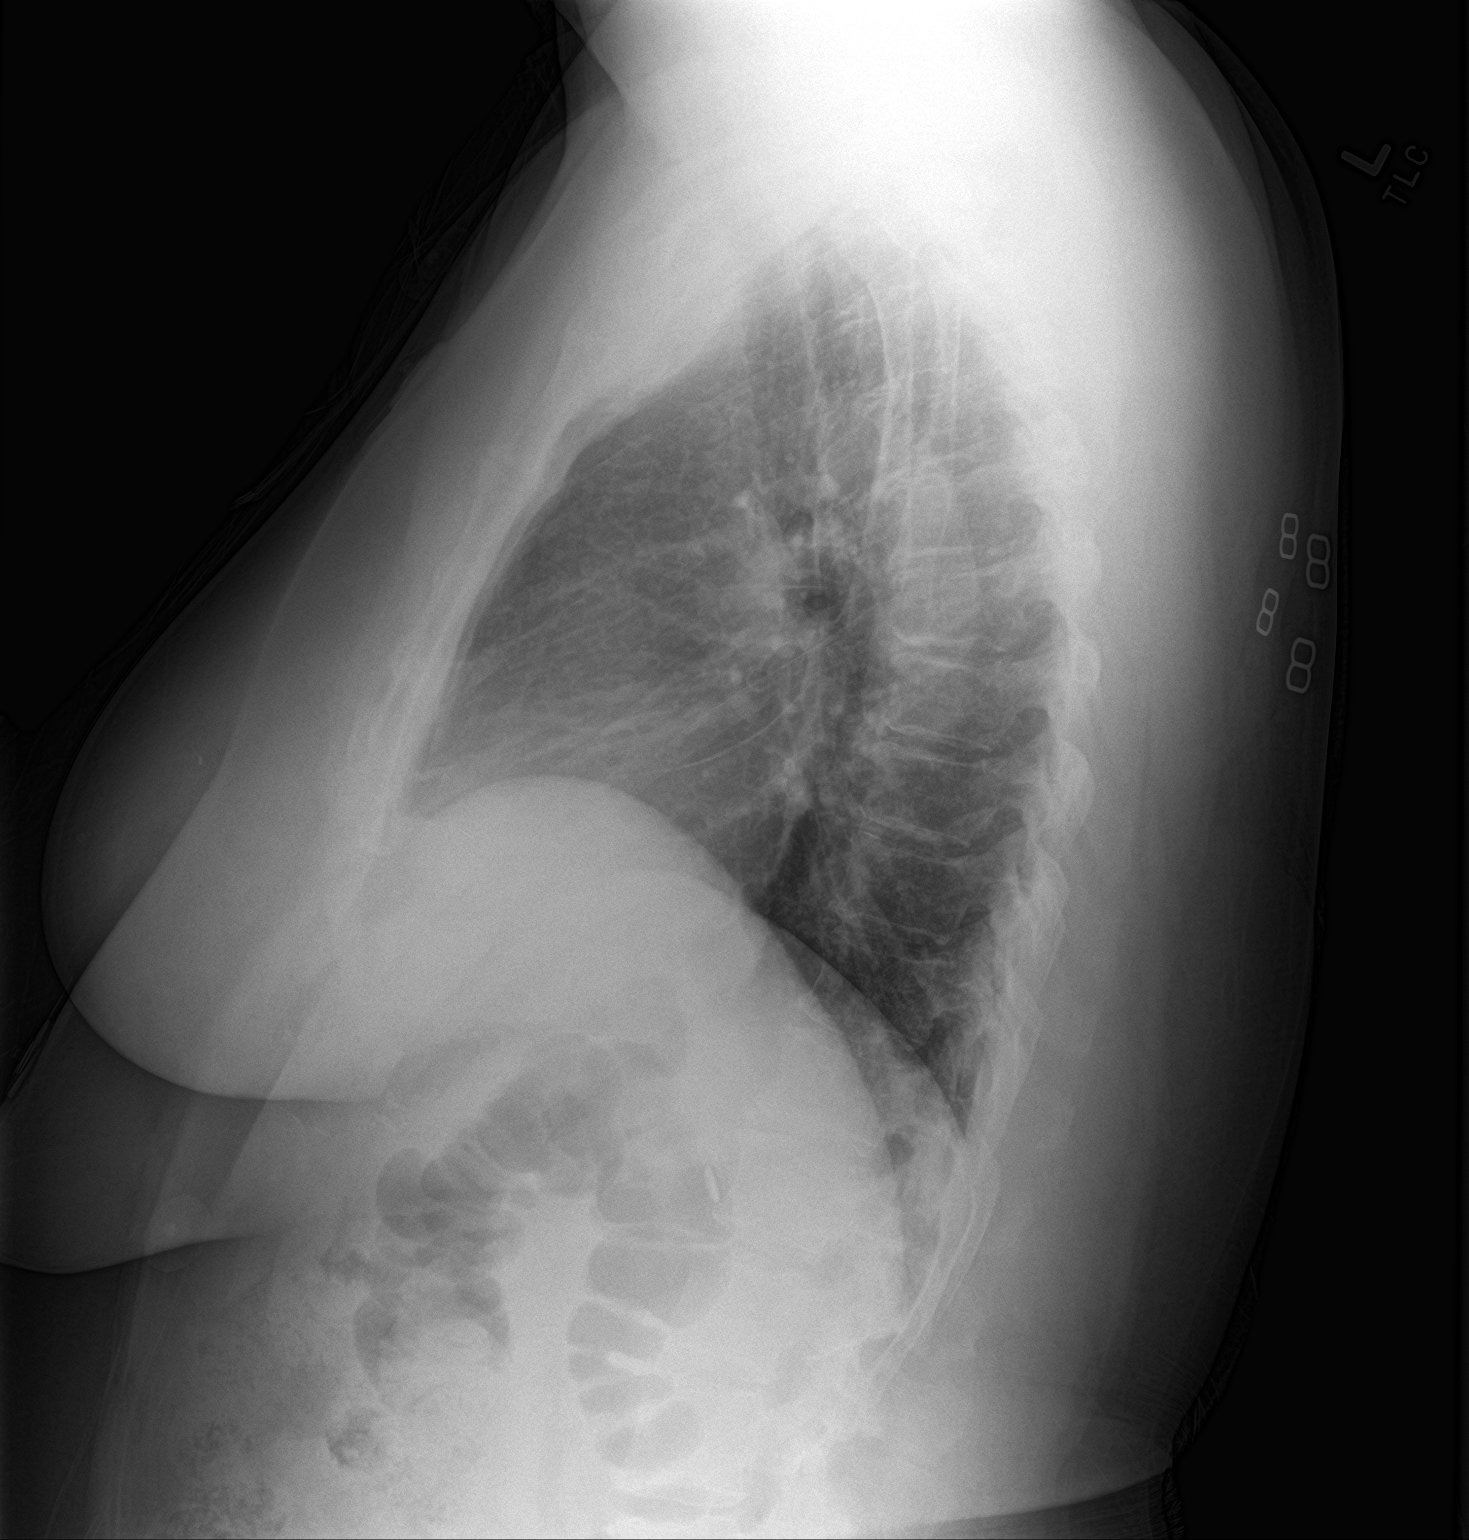

[2 of 2 positions shown; findings below may reference images not displayed]

FINDINGS: The heart size and mediastinal contours are within normal limits.
Both lungs are clear. The visualized skeletal structures are
unremarkable.
IMPRESSION: No active cardiopulmonary disease.

## 2021-06-18 ENCOUNTER — Ambulatory Visit
Admission: RE | Admit: 2021-06-18 | Discharge: 2021-06-18 | Disposition: A | Discharge status: Home / Self Care | Payer: BC Managed Care – PPO | Source: Ambulatory Visit | Attending: Sports Medicine | Admitting: Sports Medicine

## 2021-06-18 ENCOUNTER — Other Ambulatory Visit: Payer: Self-pay

## 2021-06-18 ENCOUNTER — Other Ambulatory Visit: Payer: Self-pay | Admitting: Sports Medicine

## 2021-06-18 DIAGNOSIS — M545 Low back pain, unspecified: Secondary | ICD-10-CM

## 2021-07-15 DIAGNOSIS — E7800 Pure hypercholesterolemia, unspecified: Secondary | ICD-10-CM | POA: Diagnosis not present

## 2021-07-15 DIAGNOSIS — Z6833 Body mass index (BMI) 33.0-33.9, adult: Secondary | ICD-10-CM | POA: Diagnosis not present

## 2021-08-13 DIAGNOSIS — Z20822 Contact with and (suspected) exposure to covid-19: Secondary | ICD-10-CM | POA: Diagnosis not present

## 2021-09-09 DIAGNOSIS — M545 Low back pain, unspecified: Secondary | ICD-10-CM | POA: Diagnosis not present

## 2021-09-09 DIAGNOSIS — R300 Dysuria: Secondary | ICD-10-CM | POA: Diagnosis not present

## 2021-10-03 DIAGNOSIS — L720 Epidermal cyst: Secondary | ICD-10-CM | POA: Diagnosis not present

## 2021-11-08 ENCOUNTER — Other Ambulatory Visit: Payer: Self-pay | Admitting: Gastroenterology

## 2021-11-08 DIAGNOSIS — A048 Other specified bacterial intestinal infections: Secondary | ICD-10-CM

## 2021-12-05 DIAGNOSIS — N6002 Solitary cyst of left breast: Secondary | ICD-10-CM | POA: Diagnosis not present

## 2021-12-05 DIAGNOSIS — L720 Epidermal cyst: Secondary | ICD-10-CM | POA: Diagnosis not present

## 2022-01-19 IMAGING — DX DG CHEST 1V PORT
1 series · 1 of 1 positions shown · non-contrast
Comparison: 03/27/2020

CLINICAL DATA: 54-year-old female with history of gastrointestinal
hemorrhage

EXAM:
PORTABLE CHEST 1 VIEW

[chest ap]
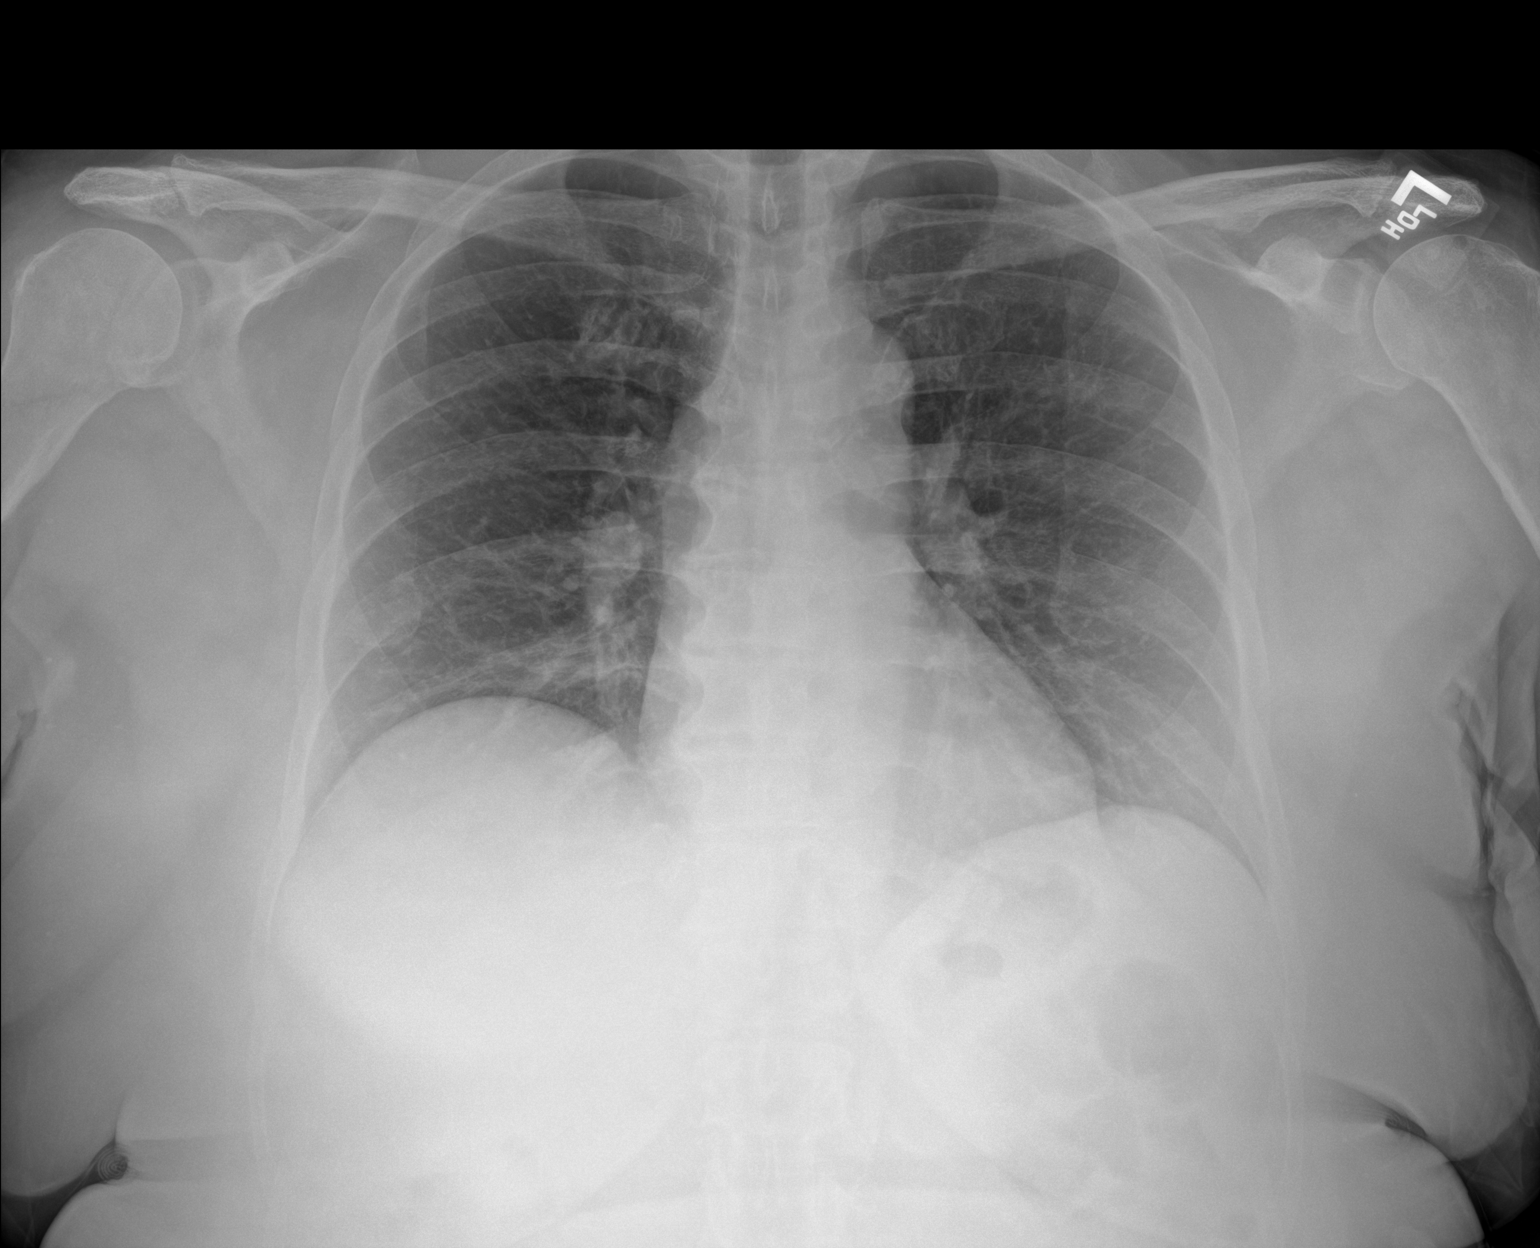

[1 of 1 positions shown; findings below may reference images not displayed]

FINDINGS: Cardiomediastinal silhouette unchanged in size and contour. Low lung
volumes. No pneumothorax or pleural effusion. No confluent airspace
disease. Coarsened interstitial markings similar to the prior.

No displaced fracture
IMPRESSION: Low lung volumes without evidence of acute cardiopulmonary disease.

## 2022-01-28 DIAGNOSIS — R7309 Other abnormal glucose: Secondary | ICD-10-CM | POA: Diagnosis not present

## 2022-01-28 DIAGNOSIS — R635 Abnormal weight gain: Secondary | ICD-10-CM | POA: Diagnosis not present

## 2022-01-28 DIAGNOSIS — Z6833 Body mass index (BMI) 33.0-33.9, adult: Secondary | ICD-10-CM | POA: Diagnosis not present

## 2022-02-03 DIAGNOSIS — Z6833 Body mass index (BMI) 33.0-33.9, adult: Secondary | ICD-10-CM | POA: Diagnosis not present

## 2022-02-03 DIAGNOSIS — E7800 Pure hypercholesterolemia, unspecified: Secondary | ICD-10-CM | POA: Diagnosis not present

## 2022-02-24 DIAGNOSIS — Z79810 Long term (current) use of selective estrogen receptor modulators (SERMs): Secondary | ICD-10-CM | POA: Diagnosis not present

## 2022-02-24 DIAGNOSIS — Z1231 Encounter for screening mammogram for malignant neoplasm of breast: Secondary | ICD-10-CM | POA: Diagnosis not present

## 2022-02-24 DIAGNOSIS — R928 Other abnormal and inconclusive findings on diagnostic imaging of breast: Secondary | ICD-10-CM | POA: Diagnosis not present

## 2022-02-24 DIAGNOSIS — Z801 Family history of malignant neoplasm of trachea, bronchus and lung: Secondary | ICD-10-CM | POA: Diagnosis not present

## 2022-02-24 DIAGNOSIS — R2232 Localized swelling, mass and lump, left upper limb: Secondary | ICD-10-CM | POA: Diagnosis not present

## 2022-02-24 DIAGNOSIS — Z08 Encounter for follow-up examination after completed treatment for malignant neoplasm: Secondary | ICD-10-CM | POA: Diagnosis not present

## 2022-02-24 DIAGNOSIS — Z806 Family history of leukemia: Secondary | ICD-10-CM | POA: Diagnosis not present

## 2022-02-24 DIAGNOSIS — Z923 Personal history of irradiation: Secondary | ICD-10-CM | POA: Diagnosis not present

## 2022-02-24 DIAGNOSIS — D0512 Intraductal carcinoma in situ of left breast: Secondary | ICD-10-CM | POA: Diagnosis not present

## 2022-02-24 DIAGNOSIS — L02412 Cutaneous abscess of left axilla: Secondary | ICD-10-CM | POA: Diagnosis not present

## 2022-02-24 DIAGNOSIS — Z853 Personal history of malignant neoplasm of breast: Secondary | ICD-10-CM | POA: Diagnosis not present

## 2022-04-30 DIAGNOSIS — R002 Palpitations: Secondary | ICD-10-CM | POA: Diagnosis not present

## 2022-04-30 DIAGNOSIS — Z853 Personal history of malignant neoplasm of breast: Secondary | ICD-10-CM | POA: Diagnosis not present

## 2022-04-30 DIAGNOSIS — Z0000 Encounter for general adult medical examination without abnormal findings: Secondary | ICD-10-CM | POA: Diagnosis not present

## 2022-04-30 DIAGNOSIS — Z6833 Body mass index (BMI) 33.0-33.9, adult: Secondary | ICD-10-CM | POA: Diagnosis not present

## 2022-04-30 DIAGNOSIS — E669 Obesity, unspecified: Secondary | ICD-10-CM | POA: Diagnosis not present

## 2022-04-30 DIAGNOSIS — J309 Allergic rhinitis, unspecified: Secondary | ICD-10-CM | POA: Diagnosis not present

## 2022-04-30 DIAGNOSIS — Z13220 Encounter for screening for lipoid disorders: Secondary | ICD-10-CM | POA: Diagnosis not present

## 2022-05-20 DIAGNOSIS — N870 Mild cervical dysplasia: Secondary | ICD-10-CM | POA: Diagnosis not present

## 2022-05-20 DIAGNOSIS — Z780 Asymptomatic menopausal state: Secondary | ICD-10-CM | POA: Diagnosis not present

## 2022-05-20 DIAGNOSIS — Z01419 Encounter for gynecological examination (general) (routine) without abnormal findings: Secondary | ICD-10-CM | POA: Diagnosis not present

## 2022-11-10 DIAGNOSIS — L723 Sebaceous cyst: Secondary | ICD-10-CM | POA: Diagnosis not present

## 2023-01-04 DIAGNOSIS — J309 Allergic rhinitis, unspecified: Secondary | ICD-10-CM | POA: Diagnosis not present

## 2023-01-04 DIAGNOSIS — Z20822 Contact with and (suspected) exposure to covid-19: Secondary | ICD-10-CM | POA: Diagnosis not present

## 2023-01-04 DIAGNOSIS — R059 Cough, unspecified: Secondary | ICD-10-CM | POA: Diagnosis not present

## 2023-01-04 DIAGNOSIS — J3089 Other allergic rhinitis: Secondary | ICD-10-CM | POA: Diagnosis not present

## 2023-03-02 DIAGNOSIS — Z1231 Encounter for screening mammogram for malignant neoplasm of breast: Secondary | ICD-10-CM | POA: Diagnosis not present

## 2023-03-02 DIAGNOSIS — Z1239 Encounter for other screening for malignant neoplasm of breast: Secondary | ICD-10-CM | POA: Diagnosis not present

## 2023-03-02 DIAGNOSIS — D0512 Intraductal carcinoma in situ of left breast: Secondary | ICD-10-CM | POA: Diagnosis not present

## 2023-03-24 DIAGNOSIS — L723 Sebaceous cyst: Secondary | ICD-10-CM | POA: Diagnosis not present

## 2023-03-24 DIAGNOSIS — H5712 Ocular pain, left eye: Secondary | ICD-10-CM | POA: Diagnosis not present

## 2023-04-07 DIAGNOSIS — L723 Sebaceous cyst: Secondary | ICD-10-CM | POA: Diagnosis not present

## 2023-06-02 DIAGNOSIS — Z01419 Encounter for gynecological examination (general) (routine) without abnormal findings: Secondary | ICD-10-CM | POA: Diagnosis not present

## 2023-06-09 DIAGNOSIS — L720 Epidermal cyst: Secondary | ICD-10-CM | POA: Insufficient documentation

## 2023-06-26 DIAGNOSIS — L720 Epidermal cyst: Secondary | ICD-10-CM | POA: Diagnosis not present

## 2023-07-02 DIAGNOSIS — Z09 Encounter for follow-up examination after completed treatment for conditions other than malignant neoplasm: Secondary | ICD-10-CM | POA: Diagnosis not present

## 2023-07-02 DIAGNOSIS — L720 Epidermal cyst: Secondary | ICD-10-CM | POA: Diagnosis not present

## 2023-08-10 DIAGNOSIS — Z0000 Encounter for general adult medical examination without abnormal findings: Secondary | ICD-10-CM | POA: Diagnosis not present

## 2023-08-10 DIAGNOSIS — R202 Paresthesia of skin: Secondary | ICD-10-CM | POA: Diagnosis not present

## 2023-08-10 DIAGNOSIS — Z6833 Body mass index (BMI) 33.0-33.9, adult: Secondary | ICD-10-CM | POA: Diagnosis not present

## 2023-08-10 DIAGNOSIS — Z13220 Encounter for screening for lipoid disorders: Secondary | ICD-10-CM | POA: Diagnosis not present

## 2023-08-10 DIAGNOSIS — R7303 Prediabetes: Secondary | ICD-10-CM | POA: Diagnosis not present

## 2023-08-17 ENCOUNTER — Other Ambulatory Visit (HOSPITAL_BASED_OUTPATIENT_CLINIC_OR_DEPARTMENT_OTHER): Payer: Self-pay | Admitting: Family Medicine

## 2023-08-17 DIAGNOSIS — E7800 Pure hypercholesterolemia, unspecified: Secondary | ICD-10-CM

## 2023-09-02 DIAGNOSIS — L910 Hypertrophic scar: Secondary | ICD-10-CM | POA: Insufficient documentation

## 2023-09-02 DIAGNOSIS — L720 Epidermal cyst: Secondary | ICD-10-CM | POA: Diagnosis not present

## 2023-09-02 DIAGNOSIS — Z09 Encounter for follow-up examination after completed treatment for conditions other than malignant neoplasm: Secondary | ICD-10-CM | POA: Diagnosis not present

## 2023-09-14 DIAGNOSIS — M5412 Radiculopathy, cervical region: Secondary | ICD-10-CM | POA: Diagnosis not present

## 2023-09-14 DIAGNOSIS — M25511 Pain in right shoulder: Secondary | ICD-10-CM | POA: Diagnosis not present

## 2023-09-14 DIAGNOSIS — M19011 Primary osteoarthritis, right shoulder: Secondary | ICD-10-CM | POA: Diagnosis not present

## 2023-09-15 ENCOUNTER — Ambulatory Visit (HOSPITAL_BASED_OUTPATIENT_CLINIC_OR_DEPARTMENT_OTHER)
Admission: RE | Admit: 2023-09-15 | Discharge: 2023-09-15 | Disposition: A | Discharge status: Home / Self Care | Payer: BC Managed Care – PPO | Source: Ambulatory Visit | Attending: Family Medicine | Admitting: Family Medicine

## 2023-09-15 DIAGNOSIS — E7800 Pure hypercholesterolemia, unspecified: Secondary | ICD-10-CM | POA: Insufficient documentation

## 2023-10-02 DIAGNOSIS — M25511 Pain in right shoulder: Secondary | ICD-10-CM | POA: Diagnosis not present

## 2023-10-02 DIAGNOSIS — M542 Cervicalgia: Secondary | ICD-10-CM | POA: Diagnosis not present

## 2023-10-05 DIAGNOSIS — M25511 Pain in right shoulder: Secondary | ICD-10-CM | POA: Diagnosis not present

## 2023-10-05 DIAGNOSIS — M542 Cervicalgia: Secondary | ICD-10-CM | POA: Diagnosis not present

## 2023-10-12 DIAGNOSIS — M542 Cervicalgia: Secondary | ICD-10-CM | POA: Diagnosis not present

## 2023-10-12 DIAGNOSIS — M25511 Pain in right shoulder: Secondary | ICD-10-CM | POA: Diagnosis not present

## 2023-10-19 DIAGNOSIS — M542 Cervicalgia: Secondary | ICD-10-CM | POA: Diagnosis not present

## 2023-10-19 DIAGNOSIS — M25511 Pain in right shoulder: Secondary | ICD-10-CM | POA: Diagnosis not present

## 2023-11-04 DIAGNOSIS — M5412 Radiculopathy, cervical region: Secondary | ICD-10-CM | POA: Diagnosis not present

## 2023-11-06 ENCOUNTER — Other Ambulatory Visit: Payer: Self-pay | Admitting: Sports Medicine

## 2023-11-06 DIAGNOSIS — M5412 Radiculopathy, cervical region: Secondary | ICD-10-CM

## 2023-11-16 DIAGNOSIS — R7303 Prediabetes: Secondary | ICD-10-CM | POA: Diagnosis not present

## 2023-11-16 DIAGNOSIS — I700 Atherosclerosis of aorta: Secondary | ICD-10-CM | POA: Diagnosis not present

## 2023-11-16 DIAGNOSIS — L729 Follicular cyst of the skin and subcutaneous tissue, unspecified: Secondary | ICD-10-CM | POA: Diagnosis not present

## 2023-11-19 ENCOUNTER — Ambulatory Visit
Admission: RE | Admit: 2023-11-19 | Discharge: 2023-11-19 | Disposition: A | Discharge status: Home / Self Care | Payer: BC Managed Care – PPO | Source: Ambulatory Visit | Attending: Sports Medicine | Admitting: Sports Medicine

## 2023-11-19 DIAGNOSIS — M542 Cervicalgia: Secondary | ICD-10-CM | POA: Diagnosis not present

## 2023-11-19 DIAGNOSIS — M5412 Radiculopathy, cervical region: Secondary | ICD-10-CM

## 2023-12-08 DIAGNOSIS — M5412 Radiculopathy, cervical region: Secondary | ICD-10-CM | POA: Diagnosis not present

## 2023-12-21 DIAGNOSIS — M5412 Radiculopathy, cervical region: Secondary | ICD-10-CM | POA: Diagnosis not present

## 2024-01-01 DIAGNOSIS — M5412 Radiculopathy, cervical region: Secondary | ICD-10-CM | POA: Diagnosis not present

## 2024-03-02 NOTE — Progress Notes (Signed)
 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 ER/PR positive  Oncology History Overview Note  Diagnosis:  Stage 0 (pTis, cN0, cM0) left breast ductal carcinoma in situ, incidentally found with breast reduction surgery in October 2016.   Receptor status: ER positive 95%, PR positive 60%.  Genetic Screening: None  Surgeon: Dr. Phillip Bellini Medical Oncologist: Dr. Annabella Serge Radiation Oncologist: Dr. Donia Daring   Ductal carcinoma in situ (DCIS) of left breast  07/31/2015 Initial Diagnosis   Ductal carcinoma in situ (DCIS) of breast, left breast mammoplasty specimen   07/31/2015 Surgery   Left breast mammoplasty: DCIS, grade 2, 1.0 mm and 2.0 mm.   10/10/2015 - 11/16/2015 Radiation Therapy   50 Gy in 25 fractions to the left breast without a boost due to prior mammoplasty   12/05/2015 - 2022 Hormonal Therapy   Hormonal Therapy: Tamoxifen     TREATMENT: - Bilateral mammoplasty 07/31/15, DCIS incidentally noted on final pathology. Unable to evaluate for margins. - Left breast radiation therapy (5000 cGy in 25 fractions) 10/10/15 - 11/16/15. - 12/05/15- current tamoxifen  20mg  PO QD; tentative duration 5 years. (Dr. Serge, transferred care to [facility])  HISTORY OF PRESENT ILLNESS: Jasmine Meadows is a 58 y.o. female from [place], [place], seen at the request of Dr. Sherlean Laughter regarding left breast DCIS. She underwent bilateral breast reduction on 07/31/15 with Dr. Alm Daring and was found incidentally to have two small focus of DCIS with necrosis ER/PR positive. Her pathology of her left breast specimen showed 1.43mm and 2.21mm incidental foci of ductal carcinoma in situ with necrosis, Grade 2. Margin assessment could not be made given that the tissue was submitted in aggregate. Right breast tissue was benign. She has no family history of breast or ovarian cancer. She  was on lupron and estradiol  for 2 years for menorrhagia secondary to uterine fibroids, but was taken off both by her GYN pysician.  04/29/21[name] Jasmine Meadows, 58 y.o. returns today in follow up in Survivorship clinic. Since her last visit she reports she has been doing well overall. She continues on tamoxifen  daily and will complete her 5 year course in February 2022. Denies changes in medical, surgical or family history. She has not had the covid vaccine, wants to wait until after she completes her course of tamoxifen  next year. She is no longer in the [facility] weight loss class. She is walking daily on her treadmill or outside in the nicer weather. She walks about 30 minutes a day.   02/25/21:  Jasmine Meadows, returns to clinic for annual surveillance in survivorship clinic. Since last visit, she completed 5 years of tamoxifen , discontinued in February 2022.  DEXA scan in April 2021 with normal bone density.  Continues calcium  and vitamin D supplementation as well as weightbearing exercise.  Left breast sebaceous cyst - plans to fu with dermatology for removal. Colonoscopy in January 2021 due to rectal bleeding.  Follow-up with PCP and gastroenterology with positive H. pylori infection and treatment via antibiotics. She was also found to be positive for COVID-19 infection. Her Hgb had improved to 8.3 at discharge on 1/20. She saw her PCP at [facility] on 2/11 and labs showed Hgb 11.9. She has follow-up with GI next month. She is taking an iron  pill. She denies recurrent rectal bleeding.    She has appreciated no changes in self breast exams. Denies any masses, lumps, suspicious  skin changes, nipple abnormalities, enlarged lymph nodes, upper extremity edema, or ROM issues. Denies any new onset headaches, dizziness, vision changes, mental status changes, shortness of breath, persistent cough, chest pain, night sweats, unexplained fevers, unintentional weight loss, new bone or joint pain.    02/24/22: Patient presents for annual surveillance with a history of left ductal carcinoma in situ identified incidentally after bilateral reduction, therefore her margin status could not be evaluated. She underwent breast radiation and started on tamoxifen  in February 2017 having completed 5 years of treatment. Since last visit she reports that she had a cyst removed on the left breast inferior aspect. This was done by dermatology in [place]. She noticed a new tender mass last week under her left arm.. It has not drained. It has gotten larger. She has had cysts in her pubic area.  She has had no new cancer diagnoses in her immediate family.  Imaging called and stated imaging shows abscess, no concern for carcinoma.   03/02/23: Patient presents for annual surveillance with a history of  left ductal carcinoma in situ identified incidentally after bilateral reduction, therefore her margin status could not be evaluated. She underwent breast radiation. Since last visit she reports no changes in her self breast exams. She has had no changes in her medical or surgical history. She has had no new cancer diagnoses in her immediate family.  She feels well. Energy and appetite are good.  03/07/2024: Patient presents for annual surveillance with a history of   left ductal carcinoma in situ identified incidentally after bilateral reduction, therefore her margin status could not be evaluated. She underwent breast radiation.She completed 5 years of Tamoxifen .  She noticed a right breast mass at the [facility] in January. It completely resolved. She had a similar cyst which was an epidermal inclusion cyst.She has had no other changes to her breasts. She has lost 17 pounds since last year.  She would like to talk to plastics about a tummy tuck.  PSH:  Past Surgical History:  Procedure Laterality Date  . BREAST LUMPECTOMY     left  4 yrs ago  . MINI-LAPAROTOMY  07/18/2011   Procedure: LAPAROTOMY MINI;  Surgeon: Bernice Haus, MD;  Location: [facility] OUTPATIENT OR;  Service: Gynecology;  Laterality: N/A;  . MYOMECTOMY  1990  . REDUCTION MAMMAPLASTY Bilateral 08/06/2015    PMH: Past Medical History:  Diagnosis Date  . Allergy   . Breast cancer (HCC)   . Ductal carcinoma in situ (DCIS) of breast 08/20/2015  . Myoma 07/17/2011  . Myoma   . PONV (postoperative nausea and vomiting)   . Uterine fibroids affecting pregnancy 07/17/2011    MEDS: Current Outpatient Medications on File Prior to Visit  Medication Sig  . aspirin 81 mg EC tablet Take 81 mg by mouth Once Daily.  . calcium  carbonate (OS-CAL) 1250 mg (500 mg calcium ) tablet 1 tablet.  . cetirizine (ZyrTEC) 10 mg tablet Take 10 mg by mouth Once Daily.  . diazePAM (Valium) 5 mg tablet Take 1 tablet (5 mg total) by mouth once as needed for anxiety (30 minutes prior to procedure with Dr. Luciano) for up to 1 dose.  . folic acid  (FOLVITE ) 1 mg tablet Take 1 mg by mouth Once Daily.  . metFORMIN (GLUCOPHAGE) 500 mg tablet Take 500 mg by mouth in the morning and 500 mg in the evening. Take with meals.  . multivitamin (THERAGRAN) tab tablet Take 1 tablet by mouth Once Daily.  . rosuvastatin (CRESTOR) 5 mg  tablet Take 1 tablet by mouth daily.   No current facility-administered medications on file prior to visit.     ALLERGIES: Allergies  Allergen Reactions  . Shellfish Derived Anaphylaxis (ALLERGY)  . Shellfish Containing Products Other (See Comments)     REPRODUCTIVE HISTORY: Onset menses: 15   Menopause: 47 (started on lupron and estradiol )   First pregnancy: 20  Number of pregnancy: [id number] Hormone: As above  SOCIAL HISTORY: Occupation: self-employed Environmental consultant) [employer] Marital Status: married Lives with: husband  FAMILY HISTORY: Family History  Problem Relation Age of Onset  . Lung cancer Cousin 50       older than 50; metastatic to breast  . Leukemia Maternal Grandmother        around 60yo  . Breast cancer Neg  Hx     ROS: A complete review of systems was negative except for as stated in the HPI  PERFORMANCE STATUS: Current ECOG performance status  =  0  PHYSICAL EXAMINATION:  VITAL SIGNS: BP 133/77 (BP Location: Left arm, Patient Position: Sitting)   Pulse 59   Temp 97.4 F (36.3 C) (Temporal)   Ht 1.575 m (5' 2)   Wt 76.3 kg (168 lb 3.2 oz)   SpO2 95%   BMI 30.76 kg/m   HEENT: Atraumatic, Normocephalic,Extraocular muscles are intact.  NECK: Supple, no masses, no thyromegaly. HEART: Normal rate, appears well perfused LUNGS:   non dyspneic on room air.  BREAST: Symmetric, No palpable masses bilaterally, no suspicious skin or nipple changes in either breast.  Surgical scars are well healed. Good cosmetic results LYMPHATIC: No palpable bilateral axillary, cervical or supraclavicular lymph nodes. ABDOMEN: Soft, nontender, no hepatosplenomegaly. EXTREMITIES: Without cyanosis, clubbing or edema. NEUROLOGIC: Alert and oriented x 3. Motor is normal and gait is normal. Normal mood and normal affect       IMAGING: MG Breast Screening Tomo Bilat Narrative: BILATERAL SCREENING MAMMOGRAM, 03/07/2024 9:07 AM  INDICATION: Encounter for other screening for malignant neoplasm of breast \ Z12.39 Encounter for other screening for malignant neoplasm of breast   COMPARISON: Multiple priors.  TECHNIQUE: CC and MLO views were done of each breast with digital technique, 3D tomosynthesis, and computer-aided detection.  FINDINGS:   There are no suspicious findings. A letter is sent to the patient with her results.    Impression: No specific mammographic evidence of malignancy.  Lumpectomy changes in the upper, outer left breast posterior and middle thirds. Post-surgical changes of bilateral reduction mammoplasty.  Breast composition: There are scattered areas of fibroglandular density.  BI-RADS Category: 2 - Benign findings. Recommend routine follow-up.  RECOMMENDATION: Follow-up with routine  mammography with screening mammography in 1 year.  [place]  law requires that mammography facilities inform patients of their breast density. Higher density breasts may decrease sensitivity of mammography and may be associated with an increased risk of breast cancer. Patients with heterogeneously dense or extremely dense breast tissue should talk with their doctor about alternative screening strategies. The following website includes further information to assist patients and physicians: https://www.ncacr.org/index.php/advocacy/public-awareness/breast-health. The patient lay summary letter includes their breast density, a link to additional online resources, and a recommendation for those with dense breasts to discuss this factor further with their doctor.    DEXA 02/16/20:Normal  PATHOLOGY: [facility] 07/31/2015 Left Ductal carcinoma in situ with necrosis 1.55mm and 2.27mm incidental foci of ductal carcinoma in situ with necrosis, Grade 2. Margin assissment cannot be made given that the tissue was submitted in aggregate.  Right  breast tissue was benign.  ER 95% and PR 60%  ACCESSION NUMBER:  [id number] RECEIVED: 12/07/2017 ORDERING PHYSICIAN:  PHILLIP BELLINI , MD PATIENT NAME:  MINDA CRAMP D SURGICAL PATHOLOGY REPORT RIGHT BREAST, UPPER OUTER QUADRANT. NEEDLE BIOPSY:     Fat necrosis, foamy histiocytes with multinucleated cells, hemosiderin deposition and calcifications.     No malignancy identified.   ASSESSMENT: Noni Stonesifer is a 58 y.o. female with left ductal carcinoma in situ identified incidentally after bilateral reduction, therefore her margin status could not be evaluated. She underwent breast radiation and started on tamoxifen  in February 2017 having completed 5 years of treatment.  Elantra is now 8 years out from her cancer diagnosis and is doing well.  Clinical breast exam is unremarkable.  Imaging, BI-RADS 2   PLAN:  RTC in one year after bilateral  screening mammogram. Call for any changes in breast self exam or other breast concerns.  Electronically signed by: Nena Neta Hock, NP 03/07/2024 9:28 AM

## 2024-03-07 DIAGNOSIS — L987 Excessive and redundant skin and subcutaneous tissue: Secondary | ICD-10-CM | POA: Diagnosis not present

## 2024-03-07 DIAGNOSIS — Z1231 Encounter for screening mammogram for malignant neoplasm of breast: Secondary | ICD-10-CM | POA: Diagnosis not present

## 2024-03-07 DIAGNOSIS — Z1239 Encounter for other screening for malignant neoplasm of breast: Secondary | ICD-10-CM | POA: Diagnosis not present

## 2024-03-28 DIAGNOSIS — M5412 Radiculopathy, cervical region: Secondary | ICD-10-CM | POA: Diagnosis not present

## 2024-04-25 DIAGNOSIS — E119 Type 2 diabetes mellitus without complications: Secondary | ICD-10-CM | POA: Diagnosis not present

## 2024-04-26 DIAGNOSIS — T7849XA Other allergy, initial encounter: Secondary | ICD-10-CM | POA: Diagnosis not present

## 2024-04-26 DIAGNOSIS — E669 Obesity, unspecified: Secondary | ICD-10-CM | POA: Diagnosis not present

## 2024-05-16 ENCOUNTER — Telehealth: Payer: Self-pay | Admitting: Physical Therapy

## 2024-05-16 NOTE — Telephone Encounter (Signed)
 Called pt to inquire about coming in earlier for eval given increased availability.  She has agreed to come in for the eval on Tuesday.

## 2024-05-17 ENCOUNTER — Ambulatory Visit: Admitting: Physical Therapy

## 2024-05-19 ENCOUNTER — Ambulatory Visit: Admitting: Physical Therapy

## 2024-05-19 ENCOUNTER — Ambulatory Visit: Attending: Physical Medicine and Rehabilitation | Admitting: Physical Therapy

## 2024-05-19 DIAGNOSIS — M542 Cervicalgia: Secondary | ICD-10-CM | POA: Diagnosis not present

## 2024-05-19 DIAGNOSIS — G8929 Other chronic pain: Secondary | ICD-10-CM | POA: Insufficient documentation

## 2024-05-19 DIAGNOSIS — M25511 Pain in right shoulder: Secondary | ICD-10-CM | POA: Diagnosis not present

## 2024-05-19 NOTE — Addendum Note (Signed)
 Addended by: THEOTIS TORIBIO PARAS on: 05/19/2024 10:03 PM   Modules accepted: Orders

## 2024-05-19 NOTE — Therapy (Signed)
 OUTPATIENT PHYSICAL THERAPY CERVICAL EVALUATION   Patient Name: Jasmine Meadows MRN: [id number] DOB:10/05/1966, 58 y.o., female Today's Date: 05/19/2024  END OF SESSION:  PT End of Session - 05/19/24 1524     Visit Number 1    Number of Visits 24    Date for PT Re-Evaluation 08/11/24    Authorization Type [id number]    Authorization - Visit Number 1    Authorization - Number of Visits 24    Progress Note Due on Visit 10    PT Start Time 1520    PT Stop Time 1600    PT Time Calculation (min) 40 min    Activity Tolerance Patient tolerated treatment well    Behavior During Therapy WFL for tasks assessed/performed          Past Medical History:  Diagnosis Date   Bilateral hand pain    Breast cancer (HCC)    left - radiation only   Carpal tunnel syndrome of left wrist    Carpal tunnel syndrome of right wrist 04/2012   Environmental allergies    PONV (postoperative nausea and vomiting)    Seasonal allergies    Uterine fibroid    Lupron injection every 3 mos.   Past Surgical History:  Procedure Laterality Date   BREAST REDUCTION SURGERY     CARPAL TUNNEL RELEASE  03/19/2012   Procedure: CARPAL TUNNEL RELEASE;  Surgeon: Arley JONELLE Curia, MD;  Location: [facility];  Service: Orthopedics;  Laterality: Left;   CARPAL TUNNEL RELEASE  05/07/2012   Procedure: CARPAL TUNNEL RELEASE;  Surgeon: Arley JONELLE Curia, MD;  Location: [facility];  Service: Orthopedics;  Laterality: Right;   COLONOSCOPY WITH PROPOFOL  N/A 11/07/2020   Procedure: COLONOSCOPY WITH PROPOFOL ;  Surgeon: Unk Corinn Skiff, MD;  Location: [facility];  Service: Gastroenterology;  Laterality: N/A;   ESOPHAGOGASTRODUODENOSCOPY (EGD) WITH PROPOFOL  N/A 11/07/2020   Procedure: ESOPHAGOGASTRODUODENOSCOPY (EGD) WITH PROPOFOL ;  Surgeon: Unk Corinn Skiff, MD;  Location: [facility] ENDOSCOPY;  Service: Gastroenterology;  Laterality: N/A;   MASS EXCISION Left 03/31/2014   Procedure: EXCISION CYST ;   Surgeon: Arley JONELLE Curia, MD;  Location: [facility];  Service: Orthopedics;  Laterality: Left;  ANESTHESIA:  IV REGIONAL FAB   MYOMECTOMY     TRIGGER FINGER RELEASE Left 03/31/2014   Procedure: RELEASE A-1 PULLEY LEFT RING FINGER;  Surgeon: Arley JONELLE Curia, MD;  Location:  SURGERY CENTER;  Service: Orthopedics;  Laterality: Left;   Patient Active Problem List   Diagnosis Date Noted   Acute GI bleeding    COVID-19 virus infection 11/06/2020   Breast cancer (HCC)    GERD (gastroesophageal reflux disease)    Rectal bleeding    Acute blood loss anemia    CTS (carpal tunnel syndrome) 02/13/2017   Paresthesia 01/12/2017   Weakness 01/12/2017   Hyperreflexia 01/12/2017   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   Uterine leiomyoma 01/19/2012    PCP: Dr. Olam Pinal  REFERRING PROVIDER: Dr. Darryle Running  REFERRING DIAG: M54.12 (ICD-10-CM) - Cervical radiculopathy  THERAPY DIAG:  Cervicalgia  Rationale for Evaluation and Treatment: Rehabilitation  ONSET DATE: 04/2023    SUBJECTIVE:  SUBJECTIVE STATEMENT: See pertinent history   Hand dominance: Right  PERTINENT HISTORY:  Pt started experiencing increased right sided neck pain radiating down to her thumb and index finger in the past year. This has worsened in the past couple of months. 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 She also has been experiencing increased right shoulder pain in the front of her shoulder especially when she lifts overhead. She has trouble sleeping because of the pain and she often tosses and turns. She has decided to take the next two weeks off from work to rest and recover from her ongoing pain.   PAIN:  Are you having pain?  Yes: NPRS scale: 7/10 NRPS  Pain location: Biceptal groove of right shoulder and radiates down into index and thumb   Pain description: N/t and burning   Aggravating factors: Reaching right shoulder overhead or flexing right shoulder  Relieving factors: Keeping arm down by her side  PRECAUTIONS: None  RED FLAGS: None     WEIGHT BEARING RESTRICTIONS: No  FALLS:  Has patient fallen in last 6 months? No  LIVING ENVIRONMENT: Lives with: lives with their spouse Lives in: House/apartment Stairs: Yes: External: 13 steps; on right going up and on left going up Has following equipment at home: None  OCCUPATION: Engineer, production     PLOF: Independent  PATIENT GOALS:   NEXT MD VISIT: Not sure    OBJECTIVE:  Note: Objective measures were completed at Evaluation unless otherwise noted.  VITALS: BP 125/77 HR 75 SpO2 100%  DIAGNOSTIC FINDINGS:  CLINICAL DATA:  Neck and right shoulder pain with numbness down the arm, history of breast cancer   EXAM: MRI CERVICAL SPINE WITHOUT CONTRAST   TECHNIQUE: Multiplanar, multisequence MR imaging of the cervical spine was performed. No intravenous contrast was administered.   COMPARISON:  None Available.   FINDINGS: Alignment: Preserved.   Vertebrae: Vertebral body heights are maintained. There is minor degenerative endplate irregularity at C5-C6. No marrow edema. No suspicious lesion.   Cord: No abnormal signal.   Posterior Fossa, vertebral arteries, paraspinal tissues: Unremarkable.   Disc levels:   C2-C3: Trace central disc protrusion. No significant canal or foraminal stenosis.   C3-C4: Trace central disc protrusion with endplate osteophytes and uncovertebral hypertrophy. Minor canal stenosis. Mild right and minor left foraminal stenosis.   C4-C5: Disc bulge with endplate osteophytes and uncovertebral hypertrophy. Minor canal stenosis. Mild foraminal stenosis.   C5-C6: Disc bulge with superimposed central protrusion and  endplate osteophytes. Uncovertebral hypertrophy. Moderate canal stenosis. Moderate foraminal stenosis.   C6-C7:  No significant stenosis.   C7-T1:  No significant stenosis.   IMPRESSION: Multilevel degenerative changes as detailed above are greatest at C5-C6.     Electronically Signed   By: Santina Blanch M.D.   On: 12/02/2023 10:25  PATIENT SURVEYS:  NDI:  NECK DISABILITY INDEX  Date: 05/19/24 Score  Pain intensity 3 = The pain is fairly severe at the moment  2. Personal care (washing, dressing, etc.) 0 = I can look after myself normally without causing extra pain  3. Lifting 1 =  I can lift heavy weights but it gives extra pain  4. Reading 0 = I can read as much as I want to with no pain in my neck  5. Headaches 1 =  I have slight headaches, which come infrequently  6. Concentration 0 =  I can concentrate fully when I want to with  no difficulty  7. Work 1 =  I can only do my usual work, but no more  8. Driving 1 =  I can drive my car as long as I want with slight pain in my neck  9. Sleeping 4 = My sleep is greatly disturbed (3-5 hrs sleepless)   10. Recreation 0 = I am able to engage in all my recreation activities with no neck pain at all  Total 11/50 (22%)   Minimum Detectable Change (90% confidence): 5 points or 10% points  COGNITION: Overall cognitive status: Within functional limits for tasks assessed  SENSATION: Light touch: Impaired  numbness and tingling along C6-C7 dermatome    POSTURE: rounded shoulders and forward head  PALPATION: Right bicipital groove     CERVICAL ROM:   Active ROM A/PROM (deg) eval  Flexion 45  Extension 45*  Right lateral flexion 45   Left lateral flexion 45  Right rotation 60  Left rotation 60   (Blank rows = not tested)  UPPER EXTREMITY ROM:  Active ROM Right eval Left eval  Shoulder flexion 180 180  Shoulder extension    Shoulder abduction 180 180  Shoulder adduction    Shoulder extension    Shoulder internal  rotation    Shoulder external rotation    Elbow flexion    Elbow extension    Wrist flexion    Wrist extension    Wrist ulnar deviation    Wrist radial deviation    Wrist pronation    Wrist supination    Combined Shoulder IR                     PSIS*          PSIS  Combined Shoulder ER                    Occiput        Occiput    (Blank rows = not tested)  UPPER EXTREMITY MMT:  MMT Right eval Left eval  Shoulder flexion 4-* 4  Shoulder extension 4 4  Shoulder abduction 4-* 4  Shoulder adduction    Shoulder internal rotation    Shoulder external rotation    Middle trapezius 4 4  Lower trapezius 4 4  Elbow flexion    Elbow extension    Wrist flexion    Wrist extension    Wrist ulnar deviation    Wrist radial deviation    Wrist pronation    Wrist supination    Grip strength     (Blank rows = not tested)  CERVICAL SPECIAL TESTS:  Distraction test: Testing not performed    TREATMENT DATE:   05/19/24: Eccentric shoulder flexion for RUE 2 x 10  -min VC for sequence of exercise   Upper Trap Stretch 2 x 60 sec   -min VC for hand placement  PATIENT EDUCATION:  Education details: Form and technique for correct performance of exercise.  Person educated: Patient Education method: Explanation, Demonstration, Verbal cues, and Handouts Education comprehension: verbalized understanding, returned demonstration, and verbal cues required  HOME EXERCISE PROGRAM: Access Code: [email or web address] URL: [email or web address] Date: 05/19/2024 Prepared by: Toribio Servant  Exercises - Seated Upper Trapezius Stretch  - 1 x daily - 7 x weekly - 3 reps - 60 sec  hold - Standing Eccentric Shoulder Flexion at Wall  - 1 x daily - 7 x weekly - 2 sets - 10 reps  ASSESSMENT:  CLINICAL IMPRESSION: Patient is a 58 y.o. AA female who was seen today for  physical therapy evaluation and treatment for right sided neck and shoulder pain. Pt has signs and symptoms of right sided cervical stenosis with subacromial impingement and bicep's tendinosis with pain with shoulder flexion and cervical extension along with radiating pain causing numbness and tingling in right index finger and thumb. Myotomal weakness rules out. 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 She will benefit from skilled PT to address these aforementioned deficits to improve her right shoulder function to carry out UE tasks required for her job without being limited by pain and discomfort.    OBJECTIVE IMPAIRMENTS: decreased strength, impaired sensation, impaired UE functional use, postural dysfunction, and pain.   ACTIVITY LIMITATIONS: carrying, lifting, reach over head, and hygiene/grooming  PARTICIPATION LIMITATIONS: community activity and occupation  PERSONAL FACTORS: Age, Time since onset of injury/illness/exacerbation, and 1 comorbidity: h/o breast cancer are also affecting patient's functional outcome.   REHAB POTENTIAL: Good  CLINICAL DECISION MAKING: Stable/uncomplicated  EVALUATION COMPLEXITY: Low   GOALS: Goals reviewed with patient? No  SHORT TERM GOALS: Target date: 06/02/2024  Patient will demonstrate undestanding of home exercise plan by performing exercises correctly with evidence of good carry over with min to no verbal or tactile cues .   Baseline: NT  Goal status: INITIAL    LONG TERM GOALS: Target date: 08/11/2024  Patient will show a statistically significant improvement in her neck function as evidence by >=7.5 pt decrease in her neck disability index score to better be able to move neck to improve visual field while driving to avoid accidents. Vernel et al, 2009)       Baseline: 11/50 (22%)       Goal status:  INITIAL  2.  Patient will improve right shoulder strength by 1/3 grade MMT (4- to 4) for improved right shoulder function and stability.  Baseline: Shoulder Flex R/L  4-/4, Shoulder Abd R/L 4-/4  Goal status: INITIAL  3.  Patient will be able to perform job related tasks without experiencing right sided neck and shoulder pain that is not >=3/10 NRPS for improved right shoulder function in order to carry out her job related tasks.  Baseline: 7/10 NRPS   Goal status: INITIAL    PLAN:  PT FREQUENCY: 1-2x/week  PT DURATION: 12 weeks  PLANNED INTERVENTIONS: 97164- PT Re-evaluation, 97750- Physical Performance Testing, 97110-Therapeutic exercises, 97530- Therapeutic activity, 97112- Neuromuscular re-education, 97535- Self Care, [id number]- Manual therapy, [phone]- Aquatic Therapy, [id number]- Electrical stimulation (unattended), [phone]- Electrical stimulation (manual), 20560 (1-2 muscles), 20561 (3+ muscles)- Dry Needling, Patient/Family education, Taping, Joint mobilization, Joint manipulation, Spinal manipulation, Spinal mobilization, Vestibular training, Cryotherapy, and Moist heat  PLAN FOR NEXT SESSION: Cervical traction. Measure shoulder ER/IR at 90 deg abd.  Periscapular strengthening.  Toribio Servant PT, DPT  [facility] [address], [place], [address] Phone: [phone]   Fax:  [phone]

## 2024-05-24 ENCOUNTER — Encounter: Admitting: Physical Therapy

## 2024-05-25 ENCOUNTER — Ambulatory Visit: Attending: Physical Medicine and Rehabilitation | Admitting: Physical Therapy

## 2024-05-25 DIAGNOSIS — M25511 Pain in right shoulder: Secondary | ICD-10-CM | POA: Insufficient documentation

## 2024-05-25 DIAGNOSIS — M542 Cervicalgia: Secondary | ICD-10-CM | POA: Insufficient documentation

## 2024-05-25 DIAGNOSIS — G8929 Other chronic pain: Secondary | ICD-10-CM | POA: Insufficient documentation

## 2024-05-25 NOTE — Therapy (Signed)
 OUTPATIENT PHYSICAL THERAPY CERVICAL TREATMENT     Patient Name: Jasmine Meadows MRN: [id number] DOB:15-May-1966, 58 y.o., female Today's Date: 05/25/2024  END OF SESSION:  PT End of Session - 05/25/24 1349     Visit Number 2    Number of Visits 24    Date for PT Re-Evaluation 08/11/24    Authorization Type [id number]    Authorization - Visit Number 2    Authorization - Number of Visits 24    Progress Note Due on Visit 10    PT Start Time 1345    PT Stop Time 1430    PT Time Calculation (min) 45 min    Activity Tolerance Patient tolerated treatment well    Behavior During Therapy WFL for tasks assessed/performed          Past Medical History:  Diagnosis Date   Bilateral hand pain    Breast cancer (HCC)    left - radiation only   Carpal tunnel syndrome of left wrist    Carpal tunnel syndrome of right wrist 04/2012   Environmental allergies    PONV (postoperative nausea and vomiting)    Seasonal allergies    Uterine fibroid    Lupron injection every 3 mos.   Past Surgical History:  Procedure Laterality Date   BREAST REDUCTION SURGERY     CARPAL TUNNEL RELEASE  03/19/2012   Procedure: CARPAL TUNNEL RELEASE;  Surgeon: Arley JONELLE Curia, MD;  Location: [facility];  Service: Orthopedics;  Laterality: Left;   CARPAL TUNNEL RELEASE  05/07/2012   Procedure: CARPAL TUNNEL RELEASE;  Surgeon: Arley JONELLE Curia, MD;  Location: [facility];  Service: Orthopedics;  Laterality: Right;   COLONOSCOPY WITH PROPOFOL  N/A 11/07/2020   Procedure: COLONOSCOPY WITH PROPOFOL ;  Surgeon: Unk Corinn Skiff, MD;  Location: [facility] END[facility]COPY;  Service: Gastroenterology;  Laterality: N/A;   ESOPHAGOGASTRODUODENOSCOPY (EGD) WITH PROPOFOL  N/A 11/07/2020   Procedure: ESOPHAGOGASTRODUODENOSCOPY (EGD) WITH PROPOFOL ;  Surgeon: Unk Corinn Skiff, MD;  Location: [facility] ENDOSCOPY;  Service: Gastroenterology;  Laterality: N/A;   MASS EXCISION Left 03/31/2014   Procedure: EXCISION CYST ;   Surgeon: Arley JONELLE Curia, MD;  Location: [facility];  Service: Orthopedics;  Laterality: Left;  ANESTHESIA:  IV REGIONAL FAB   MYOMECTOMY     TRIGGER FINGER RELEASE Left 03/31/2014   Procedure: RELEASE A-1 PULLEY LEFT RING FINGER;  Surgeon: Arley JONELLE Curia, MD;  Location:  SURGERY CENTER;  Service: Orthopedics;  Laterality: Left;   Patient Active Problem List   Diagnosis Date Noted   Acute GI bleeding    COVID-19 virus infection 11/06/2020   Breast cancer (HCC)    GERD (gastroesophageal reflux disease)    Rectal bleeding    Acute blood loss anemia    CTS (carpal tunnel syndrome) 02/13/2017   Paresthesia 01/12/2017   Weakness 01/12/2017   Hyperreflexia 01/12/2017   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   Uterine leiomyoma 01/19/2012    PCP: Dr. Olam Pinal  REFERRING PROVIDER: Dr. Darryle Running  REFERRING DIAG: M54.12 (ICD-10-CM) - Cervical radiculopathy  THERAPY DIAG:  Cervicalgia  Chronic right shoulder pain  Rationale for Evaluation and Treatment: Rehabilitation  ONSET DATE: 04/2023    SUBJECTIVE:  SUBJECTIVE STATEMENT: Pt reports that she felt increased pain yesterday after baking a lot of cakes. She ordered the cervical pillow and she is awaiting the arrival of pillow and it should arrive tomorrow.   Hand dominance: Right  PERTINENT HISTORY:  Pt started experiencing increased right sided neck pain radiating down to her thumb and index finger in the past year. This has worsened in the past couple of months. 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 She also has been experiencing increased right shoulder pain in the front of her shoulder especially when she lifts overhead. She has trouble  sleeping because of the pain and she often tosses and turns. She has decided to take the next two weeks off from work to rest and recover from her ongoing pain.   PAIN:  Are you having pain? Yes: NPRS scale: 5/10 NRPS  Pain location: Biceptal groove of right shoulder and radiates down into index and thumb   Pain description: N/t and burning   Aggravating factors: Reaching right shoulder overhead or flexing right shoulder  Relieving factors: Keeping arm down by her side  PRECAUTIONS: None  RED FLAGS: None     WEIGHT BEARING RESTRICTIONS: No  FALLS:  Has patient fallen in last 6 months? No  LIVING ENVIRONMENT: Lives with: lives with their spouse Lives in: House/apartment Stairs: Yes: External: 13 steps; on right going up and on left going up Has following equipment at home: None  OCCUPATION: Engineer, production     PLOF: Independent  PATIENT GOALS:   NEXT MD VISIT: Not sure    OBJECTIVE:  Note: Objective measures were completed at Evaluation unless otherwise noted.  VITALS: BP 125/77 HR 75 SpO2 100%  DIAGNOSTIC FINDINGS:  CLINICAL DATA:  Neck and right shoulder pain with numbness down the arm, history of breast cancer   EXAM: MRI CERVICAL SPINE WITHOUT CONTRAST   TECHNIQUE: Multiplanar, multisequence MR imaging of the cervical spine was performed. No intravenous contrast was administered.   COMPARISON:  None Available.   FINDINGS: Alignment: Preserved.   Vertebrae: Vertebral body heights are maintained. There is minor degenerative endplate irregularity at C5-C6. No marrow edema. No suspicious lesion.   Cord: No abnormal signal.   Posterior Fossa, vertebral arteries, paraspinal tissues: Unremarkable.   Disc levels:   C2-C3: Trace central disc protrusion. No significant canal or foraminal stenosis.   C3-C4: Trace central disc protrusion with endplate osteophytes and uncovertebral hypertrophy. Minor canal stenosis. Mild right and minor left foraminal stenosis.    C4-C5: Disc bulge with endplate osteophytes and uncovertebral hypertrophy. Minor canal stenosis. Mild foraminal stenosis.   C5-C6: Disc bulge with superimposed central protrusion and endplate osteophytes. Uncovertebral hypertrophy. Moderate canal stenosis. Moderate foraminal stenosis.   C6-C7:  No significant stenosis.   C7-T1:  No significant stenosis.   IMPRESSION: Multilevel degenerative changes as detailed above are greatest at C5-C6.     Electronically Signed   By: Santina Blanch M.D.   On: 12/02/2023 10:25  PATIENT SURVEYS:  NDI:  NECK DISABILITY INDEX  Date: 05/19/24 Score  Pain intensity 3 = The pain is fairly severe at the moment  2. Personal care (washing, dressing, etc.) 0 = I can look after myself normally without causing extra pain  3. Lifting 1 =  I can lift heavy weights but it gives extra pain  4. Reading 0 = I can read as much as I want to with no pain in my  neck  5. Headaches 1 =  I have slight headaches, which come infrequently  6. Concentration 0 =  I can concentrate fully when I want to with no difficulty  7. Work 1 =  I can only do my usual work, but no more  8. Driving 1 =  I can drive my car as long as I want with slight pain in my neck  9. Sleeping 4 = My sleep is greatly disturbed (3-5 hrs sleepless)   10. Recreation 0 = I am able to engage in all my recreation activities with no neck pain at all  Total 11/50 (22%)   Minimum Detectable Change (90% confidence): 5 points or 10% points  COGNITION: Overall cognitive status: Within functional limits for tasks assessed  SENSATION: Light touch: Impaired  numbness and tingling along C6-C7 dermatome    POSTURE: rounded shoulders and forward head  PALPATION: Right bicipital groove     CERVICAL ROM:   Active ROM A/PROM (deg) eval  Flexion 45  Extension 45*  Right lateral flexion 45   Left lateral flexion 45  Right rotation 60  Left rotation 60   (Blank rows = not tested)  UPPER EXTREMITY  ROM:  Active ROM Right eval Left eval  Shoulder flexion 180 180  Shoulder extension    Shoulder abduction 180 180  Shoulder adduction    Shoulder extension    Shoulder internal rotation    Shoulder external rotation    Elbow flexion    Elbow extension    Wrist flexion    Wrist extension    Wrist ulnar deviation    Wrist radial deviation    Wrist pronation    Wrist supination    Combined Shoulder IR                     PSIS*          PSIS  Combined Shoulder ER                    Occiput        Occiput    (Blank rows = not tested)                                 Cervical ROM: No radicular symptoms with any cervical movements                                                                 Cervical                                AROM                                            Flex    45           45  Ext      45           45                       Lat Side Bend R/L 45/45        45/40                       Rotation       R/L     60/60       60/60      Active ROM Right eval Left eval  Shoulder flexion 160 180  Shoulder extension    Shoulder abduction    Shoulder adduction    Shoulder internal rotation    Shoulder external rotation    Shoulder internal rotation at 90 deg Abd 90 90  Shoulder external rotation at 90 deg Abd 80 90  Elbow flexion    Elbow extension    Wrist flexion    Wrist extension    Wrist ulnar deviation    Wrist radial deviation    Wrist pronation    Wrist supination    Combined ER  Occiput Occiput  Combined IR   PSIS PSIS                             (Blank rows = not tested)        UPPER EXTREMITY MMT:  MMT Right eval Left eval  Shoulder flexion 4-* 4  Shoulder extension 4 4  Shoulder abduction 4-* 4  Shoulder adduction    Shoulder internal rotation    Shoulder external rotation    Shoulder internal rotation at 90 deg  4-* 4-  Shoulder external rotation at 90 deg 4-* 4-  Middle trapezius  4 4  Lower trapezius 4 4  Elbow flexion    Elbow extension    Wrist flexion    Wrist extension    Wrist ulnar deviation    Wrist radial deviation    Wrist pronation    Wrist supination    Grip strength     (Blank rows = not tested)  CERVICAL SPECIAL TESTS:  Distraction test: Testing not performed    TREATMENT DATE:   05/25/24: THEREX    UBE with seat at 7 2.5 min forward and 2.5 min backward    Supine Median Nerve Glides on RUE 1 x 10   Supine Chin Tucks on occipital float 1 x 10 with 2 sec hold    Shoulder MMT and ROM (See Above)  Supine Shoulder Flexion/Extension on RUE AROM 1 x 10   -Pt reports increased pain radiating down arm   Supine Shoulder Flexion/Extension on RUE AROM with cervical forward flexion 1 x 10  -Pt reports less radicular pain on RUE   Shoulder Flexion/Extension AAROM Pulleys 2 x 10  -min VC for forward flexion of head   Shoulder Abduction/Adduction AAROM Pulleys 2 x 10   MANUAL  Cervical traction  -Pt reports it feeling good but no relief in n/t in right thumb  PATIENT EDUCATION:  Education details: Form and technique for correct performance of exercise.  Person educated: Patient Education method: Explanation, Demonstration, Verbal cues, and Handouts Education comprehension: verbalized understanding, returned demonstration, and verbal cues required  HOME EXERCISE PROGRAM: Access Code: [email or web address] URL: [email or web address] Date: 05/25/2024 Prepared by: Toribio Servant  Exercises - Seated Shoulder Flexion AAROM with Pulley Behind  - 1 x daily - 7 x weekly - 2 sets - 20 reps - Seated Shoulder Abduction AAROM with Pulley Behind  - 1 x daily - 7 x weekly - 2 sets - 20 reps - Seated Upper Trapezius Stretch  - 1 x daily - 7 x weekly - 3 reps - 60 sec  hold - Median Nerve Glide: Do Laying Down    - 1 x daily - 7 x  weekly - 1 sets - 10 reps - Supine Chin Tucks on Flat Ball  - 1 x daily - 7 x weekly - 2 sets - 10 reps - 2 sec  hold - Standing Eccentric Shoulder Flexion at Wall  - 1 x daily - 7 x weekly - 2 sets - 10 reps  ASSESSMENT:  CLINICAL IMPRESSION: Pt presenting for initial treatment following eval for right sided cervical stenosis. She shows further signs and symptoms of cervical stenosis with concordant pain decreased with cervical flexion. PT recommended use of pillows to position head into cervical flexion to decrease symptoms in order to improve sleep. Cervical traction did not decrease radicular symptoms, thus cervical traction is not worth attempting at this point. She will continue to benefit from skilled PT to address these aforementioned deficits to improve her right shoulder function to carry out UE tasks required for her job without being limited by pain and discomfort.    OBJECTIVE IMPAIRMENTS: decreased strength, impaired sensation, impaired UE functional use, postural dysfunction, and pain.   ACTIVITY LIMITATIONS: carrying, lifting, reach over head, and hygiene/grooming  PARTICIPATION LIMITATIONS: community activity and occupation  PERSONAL FACTORS: Age, Time since onset of injury/illness/exacerbation, and 1 comorbidity: h/o breast cancer are also affecting patient's functional outcome.   REHAB POTENTIAL: Good  CLINICAL DECISION MAKING: Stable/uncomplicated  EVALUATION COMPLEXITY: Low   GOALS: Goals reviewed with patient? No  SHORT TERM GOALS: Target date: 06/02/2024  Patient will demonstrate undestanding of home exercise plan by performing exercises correctly with evidence of good carry over with min to no verbal or tactile cues .   Baseline: NT 05/25/24: Performing independently   Goal status: ONGOING      LONG TERM GOALS: Target date: 08/11/2024  Patient will show a statistically significant improvement in her neck function as evidence by >=7.5 pt decrease in her neck  disability index score to better be able to move neck to improve visual field while driving to avoid accidents. Vernel et al, 2009)       Baseline: 11/50 (22%)       Goal status: ONGOING    2.  Patient will improve right shoulder strength by 1/3 grade MMT (4- to 4) for improved right shoulder function and stability.  Baseline: Shoulder Flex R/L  4-/4, Shoulder Abd R/L 4-/4 , Shoulder ER at 90 deg abduction  R/L 4-*/4, Shoulder IR at 90 deg abd R/L 4-/4* Goal status: ONGOING    3.  Patient will be able to perform job related tasks without experiencing right sided neck and shoulder pain that is not >=3/10 NRPS for improved right shoulder function in order to carry out her job related tasks.  Baseline: 7/10 NRPS  Goal status: ONGOING      PLAN:  PT FREQUENCY: 1-2x/week  PT DURATION: 12 weeks  PLANNED INTERVENTIONS: 97164- PT Re-evaluation, 97750- Physical Performance Testing, 97110-Therapeutic exercises, 97530- Therapeutic activity, [id number]- Neuromuscular re-education, 97535- Self Care, [id number]- Manual therapy, [phone]- Aquatic Therapy, [id number]- Electrical stimulation (unattended), [phone]- Electrical stimulation (manual), 20560 (1-2 muscles), 20561 (3+ muscles)- Dry Needling, Patient/Family education, Taping, Joint mobilization, Joint manipulation, Spinal manipulation, Spinal mobilization, Vestibular training, Cryotherapy, and Moist heat  PLAN FOR NEXT SESSION:  Re-examine shoulder ER/IR strength with patient in cervical flexion. Periscapular strengthening: seated rows, shoulder IR/ER at 0 deg abduction, seated median nerve glide with forearm and wrist only.  Progress eccentric bicep exercise     Toribio Servant PT, DPT  [facility] [address] [place], [address] Phone: [phone]   Fax:  [phone]

## 2024-05-31 ENCOUNTER — Ambulatory Visit: Admitting: Physical Therapy

## 2024-06-02 ENCOUNTER — Encounter: Admitting: Physical Therapy

## 2024-06-06 DIAGNOSIS — Z01419 Encounter for gynecological examination (general) (routine) without abnormal findings: Secondary | ICD-10-CM | POA: Diagnosis not present

## 2024-06-06 DIAGNOSIS — Z853 Personal history of malignant neoplasm of breast: Secondary | ICD-10-CM | POA: Diagnosis not present

## 2024-06-06 DIAGNOSIS — D251 Intramural leiomyoma of uterus: Secondary | ICD-10-CM | POA: Diagnosis not present

## 2024-06-07 ENCOUNTER — Ambulatory Visit: Admitting: Physical Therapy

## 2024-06-08 ENCOUNTER — Encounter: Payer: Self-pay | Admitting: Physical Therapy

## 2024-06-08 ENCOUNTER — Ambulatory Visit: Admitting: Physical Therapy

## 2024-06-08 DIAGNOSIS — M542 Cervicalgia: Secondary | ICD-10-CM

## 2024-06-08 DIAGNOSIS — G8929 Other chronic pain: Secondary | ICD-10-CM | POA: Diagnosis not present

## 2024-06-08 DIAGNOSIS — M25511 Pain in right shoulder: Secondary | ICD-10-CM | POA: Diagnosis not present

## 2024-06-08 NOTE — Therapy (Signed)
 OUTPATIENT PHYSICAL THERAPY CERVICAL TREATMENT     Patient Name: Jasmine Meadows MRN: [id number] DOB:1966/05/25, 58 y.o., female Today's Date: 06/08/2024  END OF SESSION:  PT End of Session - 06/08/24 0824     Visit Number 3    Number of Visits 24    Date for PT Re-Evaluation 08/11/24    Authorization Type [id number]    Authorization - Visit Number 3    Authorization - Number of Visits 24    Progress Note Due on Visit 10    PT Start Time 0820    PT Stop Time 0900    PT Time Calculation (min) 40 min    Activity Tolerance Patient tolerated treatment well    Behavior During Therapy WFL for tasks assessed/performed          Past Medical History:  Diagnosis Date   Bilateral hand pain    Breast cancer (HCC)    left - radiation only   Carpal tunnel syndrome of left wrist    Carpal tunnel syndrome of right wrist 04/2012   Environmental allergies    PONV (postoperative nausea and vomiting)    Seasonal allergies    Uterine fibroid    Lupron injection every 3 mos.   Past Surgical History:  Procedure Laterality Date   BREAST REDUCTION SURGERY     CARPAL TUNNEL RELEASE  03/19/2012   Procedure: CARPAL TUNNEL RELEASE;  Surgeon: Arley JONELLE Curia, MD;  Location: [facility];  Service: Orthopedics;  Laterality: Left;   CARPAL TUNNEL RELEASE  05/07/2012   Procedure: CARPAL TUNNEL RELEASE;  Surgeon: Arley JONELLE Curia, MD;  Location: [facility];  Service: Orthopedics;  Laterality: Right;   COLONOSCOPY WITH PROPOFOL  N/A 11/07/2020   Procedure: COLONOSCOPY WITH PROPOFOL ;  Surgeon: Unk Corinn Skiff, MD;  Location: [facility];  Service: Gastroenterology;  Laterality: N/A;   ESOPHAGOGASTRODUODENOSCOPY (EGD) WITH PROPOFOL  N/A 11/07/2020   Procedure: ESOPHAGOGASTRODUODENOSCOPY (EGD) WITH PROPOFOL ;  Surgeon: Unk Corinn Skiff, MD;  Location: [facility];  Service: Gastroenterology;  Laterality: N/A;   MASS EXCISION Left 03/31/2014   Procedure: EXCISION CYST ;   Surgeon: Arley JONELLE Curia, MD;  Location: [facility];  Service: Orthopedics;  Laterality: Left;  ANESTHESIA:  IV REGIONAL FAB   MYOMECTOMY     TRIGGER FINGER RELEASE Left 03/31/2014   Procedure: RELEASE A-1 PULLEY LEFT RING FINGER;  Surgeon: Arley JONELLE Curia, MD;  Location: [facility];  Service: Orthopedics;  Laterality: Left;   Patient Active Problem List   Diagnosis Date Noted   Acute GI bleeding    COVID-19 virus infection 11/06/2020   Breast cancer (HCC)    GERD (gastroesophageal reflux disease)    Rectal bleeding    Acute blood loss anemia    CTS (carpal tunnel syndrome) 02/13/2017   Paresthesia 01/12/2017   Weakness 01/12/2017   Hyperreflexia 01/12/2017   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   Uterine leiomyoma 01/19/2012    PCP: Dr. Olam Pinal  REFERRING PROVIDER: Dr. Darryle Running  REFERRING DIAG: M54.12 (ICD-10-CM) - Cervical radiculopathy  THERAPY DIAG:  Cervicalgia  Chronic right shoulder pain  Rationale for Evaluation and Treatment: Rehabilitation  ONSET DATE: 04/2023    SUBJECTIVE:  SUBJECTIVE STATEMENT: Pt continues to feel experience pain but she has has some improvement with using cervical pillow. She has tried sleeping on her back but that has been difficult because she usually sleeps on her stomach.    Hand dominance: Right  PERTINENT HISTORY:  Pt started experiencing increased right sided neck pain radiating down to her thumb and index finger in the past year. This has worsened in the past couple of months. 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 She also has been experiencing increased right shoulder pain in the front of her shoulder especially when she lifts  overhead. She has trouble sleeping because of the pain and she often tosses and turns. She has decided to take the next two weeks off from work to rest and recover from her ongoing pain.   PAIN:  Are you having pain? Yes: NPRS scale: 5/10 NRPS  Pain location: Biceptal groove of right shoulder and radiates down into index and thumb   Pain description: N/t and burning   Aggravating factors: Reaching right shoulder overhead or flexing right shoulder  Relieving factors: Keeping arm down by her side  PRECAUTIONS: None  RED FLAGS: None     WEIGHT BEARING RESTRICTIONS: No  FALLS:  Has patient fallen in last 6 months? No  LIVING ENVIRONMENT: Lives with: lives with their spouse Lives in: House/apartment Stairs: Yes: External: 13 steps; on right going up and on left going up Has following equipment at home: None  OCCUPATION: Engineer, production     PLOF: Independent  PATIENT GOALS:   NEXT MD VISIT: Not sure    OBJECTIVE:  Note: Objective measures were completed at Evaluation unless otherwise noted.  VITALS: BP 125/77 HR 75 SpO2 100%  DIAGNOSTIC FINDINGS:  CLINICAL DATA:  Neck and right shoulder pain with numbness down the arm, history of breast cancer   EXAM: MRI CERVICAL SPINE WITHOUT CONTRAST   TECHNIQUE: Multiplanar, multisequence MR imaging of the cervical spine was performed. No intravenous contrast was administered.   COMPARISON:  None Available.   FINDINGS: Alignment: Preserved.   Vertebrae: Vertebral body heights are maintained. There is minor degenerative endplate irregularity at C5-C6. No marrow edema. No suspicious lesion.   Cord: No abnormal signal.   Posterior Fossa, vertebral arteries, paraspinal tissues: Unremarkable.   Disc levels:   C2-C3: Trace central disc protrusion. No significant canal or foraminal stenosis.   C3-C4: Trace central disc protrusion with endplate osteophytes and uncovertebral hypertrophy. Minor canal stenosis. Mild right and minor  left foraminal stenosis.   C4-C5: Disc bulge with endplate osteophytes and uncovertebral hypertrophy. Minor canal stenosis. Mild foraminal stenosis.   C5-C6: Disc bulge with superimposed central protrusion and endplate osteophytes. Uncovertebral hypertrophy. Moderate canal stenosis. Moderate foraminal stenosis.   C6-C7:  No significant stenosis.   C7-T1:  No significant stenosis.   IMPRESSION: Multilevel degenerative changes as detailed above are greatest at C5-C6.     Electronically Signed   By: Santina Blanch M.D.   On: 12/02/2023 10:25  PATIENT SURVEYS:  NDI:  NECK DISABILITY INDEX  Date: 05/19/24 Score  Pain intensity 3 = The pain is fairly severe at the moment  2. Personal care (washing, dressing, etc.) 0 = I can look after myself normally without causing extra pain  3. Lifting 1 =  I can lift heavy weights but it gives extra pain  4. Reading 0 = I can read as much as I want to with  no pain in my neck  5. Headaches 1 =  I have slight headaches, which come infrequently  6. Concentration 0 =  I can concentrate fully when I want to with no difficulty  7. Work 1 =  I can only do my usual work, but no more  8. Driving 1 =  I can drive my car as long as I want with slight pain in my neck  9. Sleeping 4 = My sleep is greatly disturbed (3-5 hrs sleepless)   10. Recreation 0 = I am able to engage in all my recreation activities with no neck pain at all  Total 11/50 (22%)   Minimum Detectable Change (90% confidence): 5 points or 10% points  COGNITION: Overall cognitive status: Within functional limits for tasks assessed  SENSATION: Light touch: Impaired  numbness and tingling along C6-C7 dermatome    POSTURE: rounded shoulders and forward head  PALPATION: Right bicipital groove     CERVICAL ROM:   Active ROM A/PROM (deg) eval  Flexion 45  Extension 45*  Right lateral flexion 45   Left lateral flexion 45  Right rotation 60  Left rotation 60   (Blank rows = not  tested)  UPPER EXTREMITY ROM:  Active ROM Right eval Left eval  Shoulder flexion 180 180  Shoulder extension    Shoulder abduction 180 180  Shoulder adduction    Shoulder extension    Shoulder internal rotation    Shoulder external rotation    Elbow flexion    Elbow extension    Wrist flexion    Wrist extension    Wrist ulnar deviation    Wrist radial deviation    Wrist pronation    Wrist supination    Combined Shoulder IR                     PSIS*          PSIS  Combined Shoulder ER                    Occiput        Occiput    (Blank rows = not tested)                                 Cervical ROM: No radicular symptoms with any cervical movements                                                                 Cervical                                AROM                                            Flex    45           45  Ext      45           45                       Lat Side Bend R/L 45/45        45/40                       Rotation       R/L     60/60       60/60      Active ROM Right eval Left eval  Shoulder flexion 160 180  Shoulder extension    Shoulder abduction    Shoulder adduction    Shoulder internal rotation    Shoulder external rotation    Shoulder internal rotation at 90 deg Abd 90 90  Shoulder external rotation at 90 deg Abd 80 90  Elbow flexion    Elbow extension    Wrist flexion    Wrist extension    Wrist ulnar deviation    Wrist radial deviation    Wrist pronation    Wrist supination    Combined ER  Occiput Occiput  Combined IR   PSIS PSIS                             (Blank rows = not tested)        UPPER EXTREMITY MMT:  MMT Right eval Left eval  Shoulder flexion 4-* 4  Shoulder extension 4 4  Shoulder abduction 4-* 4  Shoulder adduction    Shoulder internal rotation    Shoulder external rotation    Shoulder internal rotation at 90 deg  4-* 4-  Shoulder external rotation at 90 deg  4-* 4-  Middle trapezius 4 4  Lower trapezius 4 4  Elbow flexion    Elbow extension    Wrist flexion    Wrist extension    Wrist ulnar deviation    Wrist radial deviation    Wrist pronation    Wrist supination    Grip strength     (Blank rows = not tested)  CERVICAL SPECIAL TESTS:  Distraction test: Testing not performed    TREATMENT DATE:   06/08/24:  THEREX    UBE with seat at 7 2.5 min forward and 2.5 min backward    Upper Trap Stretch for right upper trap 2 x 60 sec    Shoulder ER/IR at 0 deg abd R/L 4/4     [employer]NAG[employer] Bedroom setup including getting a bed that elevates upper back portion to decrease incidence of pt sleeping prone.  Use of back support pillow to create an include for her to sleep on and to continue to utilize pillow to avoid cervical extension   Use of moist heat over upper traps to decrease musculature tension especially while performing stretches  Provided pt with a number of commerical options to use for moist heat  [email or web address]  MANUAL  Bilateral upper trap massage  -severe musculature tension in right upper trap   PATIENT EDUCATION:  Education details: Form and technique for correct performance of exercise.  Person educated: Patient Education method: Explanation, Demonstration, Verbal cues, and Handouts Education comprehension: verbalized understanding, returned demonstration, and verbal cues required  HOME EXERCISE PROGRAM: Access Code: [email or web address] URL: [email or web address] Date: 06/08/2024 Prepared by: Toribio Servant  Exercises - Seated Cervical Sidebending Stretch  - 1 x  daily - 7 x weekly - 3 reps - 60 sec  hold - Seated Shoulder Flexion AAROM with Pulley Behind  - 1 x daily - 7 x weekly - 2 sets - 20 reps - Seated Shoulder Abduction AAROM with Pulley Behind  - 1 x daily - 7 x weekly - 2 sets - 20 reps - Median Nerve Glide: Do Laying Down    - 1 x daily - 7 x weekly  - 1 sets - 10 reps - Supine Chin Tucks on Flat Ball  - 1 x daily - 7 x weekly - 2 sets - 10 reps - 2 sec  hold - Standing Eccentric Shoulder Flexion at Wall  - 1 x daily - 7 x weekly - 2 sets - 10 reps  ASSESSMENT:  CLINICAL IMPRESSION: Pt shows ongoing increased muscular tension throughout cervical musculature especially on right upper trap. She did experience some relief with moist heat, stretching, and massage. PT continued to educate pt on activity modification to relieve symptoms especially when sleeping and she demonstrates understanding about how to change environment to promote better sleep. She will continue to benefit from skilled PT to address these aforementioned deficits to improve her right shoulder function to carry out UE tasks required for her job without being limited by pain and discomfort.     OBJECTIVE IMPAIRMENTS: decreased strength, impaired sensation, impaired UE functional use, postural dysfunction, and pain.   ACTIVITY LIMITATIONS: carrying, lifting, reach over head, and hygiene/grooming  PARTICIPATION LIMITATIONS: community activity and occupation  PERSONAL FACTORS: Age, Time since onset of injury/illness/exacerbation, and 1 comorbidity: h/o breast cancer are also affecting patient's functional outcome.   REHAB POTENTIAL: Good  CLINICAL DECISION MAKING: Stable/uncomplicated  EVALUATION COMPLEXITY: Low   GOALS: Goals reviewed with patient? No  SHORT TERM GOALS: Target date: 06/02/2024  Patient will demonstrate undestanding of home exercise plan by performing exercises correctly with evidence of good carry over with min to no verbal or tactile cues .   Baseline: NT 05/25/24: Performing independently   Goal status: ONGOING      LONG TERM GOALS: Target date: 08/11/2024  Patient will show a statistically significant improvement in her neck function as evidence by >=7.5 pt decrease in her neck disability index score to better be able to move neck to improve  visual field while driving to avoid accidents. Vernel et al, 2009)       Baseline: 11/50 (22%)       Goal status: ONGOING    2.  Patient will improve right shoulder strength by 1/3 grade MMT (4- to 4) for improved right shoulder function and stability.  Baseline: Shoulder Flex R/L  4-/4, Shoulder Abd R/L 4-/4 , Shoulder ER at 90 deg abduction  R/L 4-*/4, Shoulder IR at 90 deg abd R/L 4-/4* Goal status: ONGOING    3.  Patient will be able to perform job related tasks without experiencing right sided neck and shoulder pain that is not >=3/10 NRPS for improved right shoulder function in order to carry out her job related tasks.  Baseline: 7/10 NRPS   Goal status: ONGOING      PLAN:  PT FREQUENCY: 1-2x/week  PT DURATION: 12 weeks  PLANNED INTERVENTIONS: 97164- PT Re-evaluation, 97750- Physical Performance Testing, 97110-Therapeutic exercises, 97530- Therapeutic activity, 97112- Neuromuscular re-education, 97535- Self Care, [id number]- Manual therapy, [phone]- Aquatic Therapy, [id number]- Electrical stimulation (unattended), [phone]- Electrical stimulation (manual), [phone] (1-2 muscles), [id number] (3+ muscles)- Dry Needling, Patient/Family education, Taping, Joint mobilization, Joint manipulation, Spinal manipulation, Spinal  mobilization, Vestibular training, Cryotherapy, and Moist heat  PLAN FOR NEXT SESSION:  Periscapular strengthening: bent over rows and seated t's, shoulder IR/ER at 90 deg abduction, seated median nerve glide with forearm and wrist only.  Progress eccentric bicep exercise     Toribio Servant PT, DPT  [facility], [facility], [facility] [address], [place], [address] Phone: [phone]   Fax:  [phone]

## 2024-06-13 ENCOUNTER — Ambulatory Visit: Admitting: Physical Therapy

## 2024-06-15 ENCOUNTER — Ambulatory Visit: Admitting: Physical Therapy

## 2024-06-22 ENCOUNTER — Ambulatory Visit: Attending: Physical Medicine and Rehabilitation | Admitting: Physical Therapy

## 2024-06-22 DIAGNOSIS — M542 Cervicalgia: Secondary | ICD-10-CM | POA: Insufficient documentation

## 2024-06-22 DIAGNOSIS — M25511 Pain in right shoulder: Secondary | ICD-10-CM | POA: Insufficient documentation

## 2024-06-22 DIAGNOSIS — G8929 Other chronic pain: Secondary | ICD-10-CM | POA: Diagnosis not present

## 2024-06-22 NOTE — Therapy (Addendum)
 OUTPATIENT PHYSICAL THERAPY CERVICAL DISCHARGE    Plan of Care Update: Due to time constraints, pt is unable to come to physical therapy at this time and she has now decided to discharge and she will pursue PT in future when she has more time.     Patient Name: Jasmine Meadows MRN: [id number] DOB:October 12, 1966, 57 y.o., female Today's Date: 06/22/2024  END OF SESSION:  PT End of Session - 06/22/24 0951     Visit Number 4    Number of Visits 24    Date for PT Re-Evaluation 08/11/24    Authorization Type [id number]    Authorization - Visit Number 4    Authorization - Number of Visits 24    Progress Note Due on Visit 10    PT Start Time 0845    PT Stop Time 0930    PT Time Calculation (min) 45 min    Activity Tolerance Patient tolerated treatment well    Behavior During Therapy WFL for tasks assessed/performed           Past Medical History:  Diagnosis Date   Bilateral hand pain    Breast cancer (HCC)    left - radiation only   Carpal tunnel syndrome of left wrist    Carpal tunnel syndrome of right wrist 04/2012   Environmental allergies    PONV (postoperative nausea and vomiting)    Seasonal allergies    Uterine fibroid    Lupron injection every 3 mos.   Past Surgical History:  Procedure Laterality Date   BREAST REDUCTION SURGERY     CARPAL TUNNEL RELEASE  03/19/2012   Procedure: CARPAL TUNNEL RELEASE;  Surgeon: Arley JONELLE Curia, MD;  Location: [facility];  Service: Orthopedics;  Laterality: Left;   CARPAL TUNNEL RELEASE  05/07/2012   Procedure: CARPAL TUNNEL RELEASE;  Surgeon: Arley JONELLE Curia, MD;  Location: [facility];  Service: Orthopedics;  Laterality: Right;   COLONOSCOPY WITH PROPOFOL  N/A 11/07/2020   Procedure: COLONOSCOPY WITH PROPOFOL ;  Surgeon: Unk Corinn Skiff, MD;  Location[facility];  Service: Gastroenterology;  Laterality: N/A;   ESOPHAGOGASTRODUODENOSCOPY (EGD) WITH PROPOFOL  N/A 11/07/2020   Procedure:  ESOPHAGOGASTRODUODENOSCOPY (EGD) WITH PROPOFOL ;  Surgeon: Unk Corinn Skiff, MD;  Location: [facility];  Service: Gastroenterology;  Laterality: N/A;   MASS EXCISION Left 03/31/2014   Procedure: EXCISION CYST ;  Surgeon: Arley JONELLE Curia, MD;  Location: [facility];  Service: Orthopedics;  Laterality: Left;  ANESTHESIA:  IV REGIONAL FAB   MYOMECTOMY     TRIGGER FINGER RELEASE Left 03/31/2014   Procedure: RELEASE A-1 PULLEY LEFT RING FINGER;  Surgeon: Arley JONELLE Curia, MD;  Location: [facility];  Service: Orthopedics;  Laterality: Left;   Patient Active Problem List   Diagnosis Date Noted   Acute GI bleeding    COVID-19 virus infection 11/06/2020   Breast cancer (HCC)    GERD (gastroesophageal reflux disease)    Rectal bleeding    Acute blood loss anemia    CTS (carpal tunnel syndrome) 02/13/2017   Paresthesia 01/12/2017   Weakness 01/12/2017   Hyperreflexia 01/12/2017   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   Uterine leiomyoma 01/19/2012    PCP: Dr. Olam Pinal  REFERRING PROVIDER: Dr. Darryle Running  REFERRING DIAG: M54.12 (ICD-10-CM) - Cervical radiculopathy  THERAPY DIAG:  Cervicalgia  Chronic right shoulder pain  Rationale for Evaluation and Treatment: Rehabilitation  ONSET DATE: 04/2023    SUBJECTIVE:  SUBJECTIVE STATEMENT: Pt reports that she was able to sleep much better since sleeping on her back and using heating pads. She is going to start using [employer] to start mixing batter rather than hand mix it.  She reports experiencing pain when reaching across her body but every other movement with her right arm feel better.  Hand dominance: Right  PERTINENT HISTORY:  Pt started experiencing increased right sided neck pain  radiating down to her thumb and index finger in the past year. This has worsened in the past couple of months. 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 She also has been experiencing increased right shoulder pain in the front of her shoulder especially when she lifts overhead. She has trouble sleeping because of the pain and she often tosses and turns. She has decided to take the next two weeks off from work to rest and recover from her ongoing pain.   PAIN:  Are you having pain? Yes: NPRS scale: 5/10 NRPS  Pain location: Biceptal groove of right shoulder and radiates down into index and thumb   Pain description: N/t and burning   Aggravating factors: Reaching right shoulder overhead or flexing right shoulder  Relieving factors: Keeping arm down by her side  PRECAUTIONS: None  RED FLAGS: None     WEIGHT BEARING RESTRICTIONS: No  FALLS:  Has patient fallen in last 6 months? No  LIVING ENVIRONMENT: Lives with: lives with their spouse Lives in: House/apartment Stairs: Yes: External: 13 steps; on right going up and on left going up Has following equipment at home: None  OCCUPATION: Engineer, production     PLOF: Independent  PATIENT GOALS:   NEXT MD VISIT: Not sure    OBJECTIVE:  Note: Objective measures were completed at Evaluation unless otherwise noted.  VITALS: BP 125/77 HR 75 SpO2 100%  DIAGNOSTIC FINDINGS:  CLINICAL DATA:  Neck and right shoulder pain with numbness down the arm, history of breast cancer   EXAM: MRI CERVICAL SPINE WITHOUT CONTRAST   TECHNIQUE: Multiplanar, multisequence MR imaging of the cervical spine was performed. No intravenous contrast was administered.   COMPARISON:  None Available.   FINDINGS: Alignment: Preserved.   Vertebrae: Vertebral body heights are maintained. There is minor degenerative endplate irregularity at C5-C6. No marrow edema. No suspicious lesion.   Cord: No abnormal signal.    Posterior Fossa, vertebral arteries, paraspinal tissues: Unremarkable.   Disc levels:   C2-C3: Trace central disc protrusion. No significant canal or foraminal stenosis.   C3-C4: Trace central disc protrusion with endplate osteophytes and uncovertebral hypertrophy. Minor canal stenosis. Mild right and minor left foraminal stenosis.   C4-C5: Disc bulge with endplate osteophytes and uncovertebral hypertrophy. Minor canal stenosis. Mild foraminal stenosis.   C5-C6: Disc bulge with superimposed central protrusion and endplate osteophytes. Uncovertebral hypertrophy. Moderate canal stenosis. Moderate foraminal stenosis.   C6-C7:  No significant stenosis.   C7-T1:  No significant stenosis.   IMPRESSION: Multilevel degenerative changes as detailed above are greatest at C5-C6.     Electronically Signed   By: Santina Blanch M.D.   On: 12/02/2023 10:25  PATIENT SURVEYS:  NDI:  NECK DISABILITY INDEX  Date: 05/19/24 Score  Pain intensity 3 = The pain is fairly severe at the moment  2. Personal care (washing, dressing, etc.) 0 = I can look after myself normally without causing extra pain  3. Lifting 1 =  I can lift heavy weights but  it gives extra pain  4. Reading 0 = I can read as much as I want to with no pain in my neck  5. Headaches 1 =  I have slight headaches, which come infrequently  6. Concentration 0 =  I can concentrate fully when I want to with no difficulty  7. Work 1 =  I can only do my usual work, but no more  8. Driving 1 =  I can drive my car as long as I want with slight pain in my neck  9. Sleeping 4 = My sleep is greatly disturbed (3-5 hrs sleepless)   10. Recreation 0 = I am able to engage in all my recreation activities with no neck pain at all  Total 11/50 (22%)   Minimum Detectable Change (90% confidence): 5 points or 10% points  COGNITION: Overall cognitive status: Within functional limits for tasks assessed  SENSATION: Light touch: Impaired  numbness  and tingling along C6-C7 dermatome    POSTURE: rounded shoulders and forward head  PALPATION: Right bicipital groove     CERVICAL ROM:   Active ROM A/PROM (deg) eval  Flexion 45  Extension 45*  Right lateral flexion 45   Left lateral flexion 45  Right rotation 60  Left rotation 60   (Blank rows = not tested)  UPPER EXTREMITY ROM:  Active ROM Right eval Left eval  Shoulder flexion 180 180  Shoulder extension    Shoulder abduction 180 180  Shoulder adduction    Shoulder extension    Shoulder internal rotation    Shoulder external rotation    Elbow flexion    Elbow extension    Wrist flexion    Wrist extension    Wrist ulnar deviation    Wrist radial deviation    Wrist pronation    Wrist supination    Combined Shoulder IR                     PSIS*          PSIS  Combined Shoulder ER                    Occiput        Occiput    (Blank rows = not tested)                                 Cervical ROM: No radicular symptoms with any cervical movements                                                                 Cervical                                AROM                                            Flex    45           45  Ext      45           45                       Lat Side Bend R/L 45/45        45/40                       Rotation       R/L     60/60       60/60      Active ROM Right eval Left eval  Shoulder flexion 160 180  Shoulder extension    Shoulder abduction    Shoulder adduction    Shoulder internal rotation    Shoulder external rotation    Shoulder internal rotation at 90 deg Abd 90 90  Shoulder external rotation at 90 deg Abd 80 90  Elbow flexion    Elbow extension    Wrist flexion    Wrist extension    Wrist ulnar deviation    Wrist radial deviation    Wrist pronation    Wrist supination    Combined ER  Occiput Occiput  Combined IR   PSIS PSIS                             (Blank rows = not  tested)        UPPER EXTREMITY MMT:  MMT Right eval Left eval  Shoulder flexion 4-* 4  Shoulder extension 4 4  Shoulder abduction 4-* 4  Shoulder adduction    Shoulder internal rotation    Shoulder external rotation    Shoulder internal rotation at 90 deg  4-* 4-  Shoulder external rotation at 90 deg 4-* 4-  Middle trapezius 4 4  Lower trapezius 4 4  Elbow flexion    Elbow extension    Wrist flexion    Wrist extension    Wrist ulnar deviation    Wrist radial deviation    Wrist pronation    Wrist supination    Grip strength     (Blank rows = not tested)  CERVICAL SPECIAL TESTS:  Distraction test: Testing not performed    TREATMENT DATE:   06/22/24: THEREX  UBE with seat at 4 -2.5 min forward and 2.5 min backward    Bent Over Rows with #10 DB 3 x 10   -min VC to have head of dumb bell point down    Radial Nerve Glides on LUE 1 x 10  -min VC for sequence of exercise   Shoulder Horizontal Abduction 3 x 10   Seated Chin Tuck  2 x 10  with 2 sec hold    Wall Push Up with Plus 3 x 10   -min VC to increase protraction by feeling arch in her mid back and to position hands slightly below nipple line   Shoulder Flex AROM to 180 deg  on RUE  x 5   Shoulder Abd/Add AROM to 160 on RUE x 5     SELF CARE HOME MANAGEMENT   Shoulder adduction and internal rotation leads to increased joint space narrowing in subacromial space and increased rotator cuff tendon irritation.     PATIENT EDUCATION:  Education details: Form and technique for correct performance of exercise.  Person educated: Patient Education method: Explanation, Demonstration, Verbal cues, and Handouts Education comprehension: verbalized understanding, returned demonstration, and verbal cues  required  HOME EXERCISE PROGRAM: Access Code: [email or web address] URL: [email or web address] Date: 06/22/2024 Prepared by: Toribio Servant  Exercises - Seated Cervical Sidebending Stretch  - 1 x daily - 7 x weekly -  3 reps - 60 sec  hold - Seated Shoulder Flexion AAROM with Pulley Behind  - 1 x daily - 7 x weekly - 2 sets - 20 reps - Seated Shoulder Abduction AAROM with Pulley Behind  - 1 x daily - 7 x weekly - 2 sets - 20 reps - Radial Nerve Flossing  - 1 x daily - 7 x weekly - 2 sets - 10 reps - Seated Cervical Retraction  - 1 x daily - 7 x weekly - 2 sets - 10 reps - 2 sec  hold - Wall Push Up with Plus  - 3-4 x weekly - 3 sets - 10 reps - Seated Shoulder Horizontal Abduction - Thumbs Up  - 3-4 x weekly - 3 sets - 10 reps - Standing Bent Over Single Arm Scapular Row with Table Support  - 3-4 x weekly - 3 sets - 10 reps - Standing Bent Over Single Arm Scapular Row with Table Support (Mirrored)  - 3-4 x weekly - 3 sets - 10 reps  ASSESSMENT:  CLINICAL IMPRESSION: Pt exhibits significant improvement and progress towards goals during session with decreased pain response with exercise and increased periscapular strength. She attributes this to her new sleeping position which is supine and it places less pressure on her left arm. It is not clear how she was able to make change in her sleeping position, but PT encouraged pt to continue sleeping in this position.  She will continue to benefit from skilled PT to address these aforementioned deficits to impro  OBJECTIVE IMPAIRMENTS: decreased strength, impaired sensation, impaired UE functional use, postural dysfunction, and pain.   ACTIVITY LIMITATIONS: carrying, lifting, reach over head, and hygiene/grooming  PARTICIPATION LIMITATIONS: community activity and occupation  PERSONAL FACTORS: Age, Time since onset of injury/illness/exacerbation, and 1 comorbidity: h/o breast cancer are also affecting patient's functional outcome.   REHAB POTENTIAL: Good  CLINICAL DECISION MAKING: Stable/uncomplicated  EVALUATION COMPLEXITY: Low   GOALS: Goals reviewed with patient? No  SHORT TERM GOALS: Target date: 06/02/2024  Patient will demonstrate undestanding of  home exercise plan by performing exercises correctly with evidence of good carry over with min to no verbal or tactile cues .   Baseline: NT 05/25/24: Performing independently   Goal status: ACHIEVED     LONG TERM GOALS: Target date: 08/11/2024  Patient will show a statistically significant improvement in her neck function as evidence by >=7.5 pt decrease in her neck disability index score to better be able to move neck to improve visual field while driving to avoid accidents. Vernel et al, 2009)       Baseline: 11/50 (22%)       Goal status: NOT MET    2.  Patient will improve right shoulder strength by 1/3 grade MMT (4- to 4) for improved right shoulder function and stability.  Baseline: Shoulder Flex R/L  4-/4, Shoulder Abd R/L 4-/4 , Shoulder ER at 90 deg abduction  R/L 4-*/4, Shoulder IR at 90 deg abd R/L 4-/4* Goal status: NOT MET    3.  Patient will be able to perform job related tasks without experiencing right sided neck and shoulder pain that is not >=3/10 NRPS for improved right shoulder function in order to carry out her job related tasks.  Baseline: 7/10 NRPS   Goal  status: NOT MET     PLAN:  PT FREQUENCY: 1-2x/week  PT DURATION: 12 weeks  PLANNED INTERVENTIONS: 97164- PT Re-evaluation, 97750- Physical Performance Testing, 97110-Therapeutic exercises, 97530- Therapeutic activity, 97112- Neuromuscular re-education, 97535- Self Care, 02859- Manual therapy, [phone]- Aquatic Therapy, G0283- Electrical stimulation (unattended), [phone]- Electrical stimulation (manual), 20560 (1-2 muscles), 20561 (3+ muscles)- Dry Needling, Patient/Family education, Taping, Joint mobilization, Joint manipulation, Spinal manipulation, Spinal mobilization, Vestibular training, Cryotherapy, and Moist heat  PLAN FOR NEXT SESSION: Reassess long term goals for mid-point progress note.  Shoulder IR/ER at 90 deg abduction, shoulder front and lateral raises, and introduce solar flex exercises (machine at  patient's home)   Toribio Servant PT, DPT  [facility] [address] [place], [address] Phone: [phone]   Fax:  [phone]

## 2024-06-27 ENCOUNTER — Ambulatory Visit: Admitting: Physical Therapy

## 2024-06-29 ENCOUNTER — Ambulatory Visit: Admitting: Physical Therapy

## 2024-07-04 ENCOUNTER — Ambulatory Visit: Admitting: Physical Therapy

## 2024-07-06 ENCOUNTER — Encounter: Admitting: Physical Therapy

## 2024-07-12 ENCOUNTER — Ambulatory Visit: Admitting: Physical Therapy

## 2024-07-14 ENCOUNTER — Encounter: Admitting: Physical Therapy

## 2024-07-18 ENCOUNTER — Ambulatory Visit: Admitting: Physical Therapy

## 2024-08-03 ENCOUNTER — Ambulatory Visit: Admitting: Physical Therapy

## 2024-08-09 ENCOUNTER — Encounter: Admitting: Physical Therapy

## 2024-08-11 ENCOUNTER — Encounter: Admitting: Physical Therapy

## 2024-08-15 DIAGNOSIS — E663 Overweight: Secondary | ICD-10-CM | POA: Diagnosis not present

## 2024-08-15 DIAGNOSIS — Z0000 Encounter for general adult medical examination without abnormal findings: Secondary | ICD-10-CM | POA: Diagnosis not present

## 2024-08-15 DIAGNOSIS — I700 Atherosclerosis of aorta: Secondary | ICD-10-CM | POA: Diagnosis not present

## 2024-08-15 DIAGNOSIS — R7303 Prediabetes: Secondary | ICD-10-CM | POA: Diagnosis not present

## 2024-08-16 ENCOUNTER — Encounter: Admitting: Physical Therapy

## 2024-08-18 ENCOUNTER — Encounter: Admitting: Physical Therapy

## 2024-08-22 ENCOUNTER — Ambulatory Visit

## 2024-08-22 ENCOUNTER — Encounter: Payer: Self-pay | Admitting: Podiatry

## 2024-08-22 ENCOUNTER — Ambulatory Visit: Admitting: Podiatry

## 2024-08-22 DIAGNOSIS — M722 Plantar fascial fibromatosis: Secondary | ICD-10-CM

## 2024-08-22 DIAGNOSIS — R7303 Prediabetes: Secondary | ICD-10-CM | POA: Insufficient documentation

## 2024-08-22 DIAGNOSIS — D2372 Other benign neoplasm of skin of left lower limb, including hip: Secondary | ICD-10-CM

## 2024-08-22 DIAGNOSIS — E663 Overweight: Secondary | ICD-10-CM | POA: Insufficient documentation

## 2024-08-22 DIAGNOSIS — Z853 Personal history of malignant neoplasm of breast: Secondary | ICD-10-CM | POA: Insufficient documentation

## 2024-08-22 DIAGNOSIS — Z87898 Personal history of other specified conditions: Secondary | ICD-10-CM | POA: Insufficient documentation

## 2024-08-22 DIAGNOSIS — I700 Atherosclerosis of aorta: Secondary | ICD-10-CM | POA: Insufficient documentation

## 2024-08-22 NOTE — Progress Notes (Addendum)
 Subjective:  Patient ID: Jasmine Meadows, female    DOB: 1965/12/09,  MRN: [id number] HPI Chief Complaint  Patient presents with   Foot Pain    Right foot pain    58 y.o. female presents with the above complaint.   ROS: Denies fever chills nausea vomiting muscle aches pains calf pain back pain chest pain shortness of breath.  Past Medical History:  Diagnosis Date   Bilateral hand pain    Breast cancer (HCC)    left - radiation only   Carpal tunnel syndrome of left wrist    Carpal tunnel syndrome of right wrist 04/2012   Environmental allergies    PONV (postoperative nausea and vomiting)    Seasonal allergies    Uterine fibroid    Lupron injection every 3 mos.   Past Surgical History:  Procedure Laterality Date   BREAST REDUCTION SURGERY     CARPAL TUNNEL RELEASE  03/19/2012   Procedure: CARPAL TUNNEL RELEASE;  Surgeon: Arley JONELLE Curia, MD;  Location: [facility];  Service: Orthopedics;  Laterality: Left;   CARPAL TUNNEL RELEASE  05/07/2012   Procedure: CARPAL TUNNEL RELEASE;  Surgeon: Arley JONELLE Curia, MD;  Location: [facility];  Service: Orthopedics;  Laterality: Right;   COLONOSCOPY WITH PROPOFOL  N/A 11/07/2020   Procedure: COLONOSCOPY WITH PROPOFOL ;  Surgeon: Unk Corinn Skiff, MD;  Location: [facility];  Service: Gastroenterology;  Laterality: N/A;   ESOPHAGOGASTRODUODENOSCOPY (EGD) WITH PROPOFOL  N/A 11/07/2020   Procedure: ESOPHAGOGASTRODUODENOSCOPY (EGD) WITH PROPOFOL ;  Surgeon: Unk Corinn Skiff, MD;  Location: [facility];  Service: Gastroenterology;  Laterality: N/A;   MASS EXCISION Left 03/31/2014   Procedure: EXCISION CYST ;  Surgeon: Arley JONELLE Curia, MD;  Location: [facility];  Service: Orthopedics;  Laterality: Left;  ANESTHESIA:  IV REGIONAL FAB   MYOMECTOMY     TRIGGER FINGER RELEASE Left 03/31/2014   Procedure: RELEASE A-1 PULLEY LEFT RING FINGER;  Surgeon: Arley JONELLE Curia, MD;  Location:  [facility]ERY [facility];   Service: Orthopedics;  Laterality: Left;    Current Outpatient Medications:    diazepam (VALIUM) 5 MG tablet, Take 5 mg by mouth., Disp: , Rfl:    metFORMIN (GLUCOPHAGE) 500 MG tablet, 1 tablet with a meal Orally Twice a day, Disp: , Rfl:    rosuvastatin (CRESTOR) 5 MG tablet, 1 tablet Orally Once a day, Disp: , Rfl:    aspirin EC 81 MG tablet, Take 81 mg by mouth daily., Disp: , Rfl:    calcium  carbonate (OS-CAL) 600 MG TABS, Take 600 mg by mouth 2 (two) times daily with a meal., Disp: , Rfl:    cetirizine (ZYRTEC) 10 MG tablet, 1 tablet, Disp: , Rfl:    folic acid  (FOLVITE ) 1 MG tablet, Take 1 mg by mouth daily., Disp: , Rfl:    ibuprofen (ADVIL) 400 MG tablet, 1 tablet with food or milk as needed Orally Three times a day As needed, Disp: , Rfl:    Multiple Vitamin (MULTI-VITAMINS) TABS, Take 1 tablet by mouth daily., Disp: , Rfl:    Multiple Vitamins-Minerals (MULTI FOR HER 50+) TABS, 1 Tablet Orally Once a day, Disp: , Rfl:    Oyster Shell Calcium  500 MG TABS, 1 tablet., Disp: , Rfl:   Allergies  Allergen Reactions   Other Anaphylaxis   Shellfish Protein-Containing Drug Products Swelling   Review of Systems Objective:  There were no vitals filed for this visit.  General: Well developed, nourished, in no acute distress, alert and  oriented x3   Dermatological: Skin is warm, dry and supple bilateral. Nails x 10 are well maintained; remaining integument appears unremarkable at this time. There are no open sores, no preulcerative lesions, no rash or signs of infection present.  Multiple benign skin lesions plantar aspect of the bilateral foot.  No open lesions or wounds  Vascular: Dorsalis Pedis artery and Posterior Tibial artery pedal pulses are 2/4 bilateral with immedate capillary fill time. Pedal hair growth present. No varicosities and no lower extremity edema present bilateral.   Neruologic: Grossly intact via light touch bilateral. Vibratory intact via tuning fork bilateral.  Protective threshold with Semmes Wienstein monofilament intact to all pedal sites bilateral. Patellar and Achilles deep tendon reflexes 2+ bilateral. No Babinski or clonus noted bilateral.   Musculoskeletal: No gross boney pedal deformities bilateral. No pain, crepitus, or limitation noted with foot and ankle range of motion bilateral. Muscular strength 5/5 in all groups tested bilateral.  Mild hammertoe deformities bilaterally.  She also has some tenderness on palpation medial calcaneal tubercles bilateral  Gait: Unassisted, Nonantalgic.    Radiographs:  None taken  Assessment & Plan:   Assessment: Painful benign skin lesions.  Painful benign neoplasms bilateral plantar foot.  Possible plantar fasciitis very minimal.  Hammertoe deformities very minimal.  Plan: Debrided all benign skin lesions.     Jasmine Clair T. [place], [place]

## 2024-08-23 ENCOUNTER — Encounter: Admitting: Physical Therapy

## 2024-08-25 ENCOUNTER — Encounter: Admitting: Physical Therapy

## 2024-08-30 ENCOUNTER — Encounter: Admitting: Physical Therapy

## 2024-09-01 ENCOUNTER — Encounter: Admitting: Physical Therapy

## 2024-09-06 ENCOUNTER — Encounter: Admitting: Physical Therapy

## 2024-09-08 ENCOUNTER — Encounter: Admitting: Physical Therapy

## 2024-09-20 ENCOUNTER — Encounter: Admitting: Physical Therapy

## 2024-09-22 ENCOUNTER — Encounter: Admitting: Physical Therapy

## 2024-10-19 ENCOUNTER — Ambulatory Visit
Admission: EM | Admit: 2024-10-19 | Discharge: 2024-10-19 | Disposition: A | Discharge status: Home / Self Care | Source: Home / Self Care

## 2024-10-19 DIAGNOSIS — J0100 Acute maxillary sinusitis, unspecified: Secondary | ICD-10-CM

## 2024-10-19 MED ORDER — AMOXICILLIN 875 MG PO TABS: 875.0000 mg | ORAL_TABLET | Freq: Two times a day (BID) | ORAL | 0 refills | Status: AC

## 2024-10-19 NOTE — ED Provider Notes (Signed)
 " CAY RALPH PELT    CSN: [id number] Arrival date & time: 10/19/24  1148      History   Chief Complaint Chief Complaint  Patient presents with   Cough    HPI Jasmine Meadows is a 58 y.o. female.  Patient presents with 2-week history of congestion and cough.  No fever, chest pain, shortness of breath, vomiting, diarrhea.  She has been treating her symptoms with Mucinex .  The history is provided by the patient and medical records.    Past Medical History:  Diagnosis Date   Bilateral hand pain    Breast cancer (HCC)    left - radiation only   Carpal tunnel syndrome of left wrist    Carpal tunnel syndrome of right wrist 04/2012   Environmental allergies    PONV (postoperative nausea and vomiting)    Seasonal allergies    Uterine fibroid    Lupron injection every 3 mos.    Patient Active Problem List   Diagnosis Date Noted   Hardening of the aorta (main artery of the heart) 08/22/2024   History of primary malignant neoplasm of breast 08/22/2024   History of ulcer disease 08/22/2024   Overweight 08/22/2024   Prediabetes 08/22/2024   Hypertrophic scar 09/02/2023   Epidermal inclusion cyst 06/09/2023   Acute GI bleeding    COVID-19 virus infection 11/06/2020   Breast cancer (HCC)    GERD (gastroesophageal reflux disease)    Rectal bleeding    Acute blood loss anemia    CTS (carpal tunnel syndrome) 02/13/2017   Paresthesia 01/12/2017   Weakness 01/12/2017   Hyperreflexia 01/12/2017   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   Uterine leiomyoma 01/19/2012    Past Surgical History:  Procedure Laterality Date   BREAST REDUCTION SURGERY     CARPAL TUNNEL RELEASE  03/19/2012   Procedure: CARPAL TUNNEL RELEASE;  Surgeon: Arley JONELLE Curia, MD;  Location: [facility];  Service: Orthopedics;  Laterality: Left;   CARPAL TUNNEL RELEASE  05/07/2012   Procedure: CARPAL TUNNEL RELEASE;  Surgeon: Arley JONELLE Curia, MD;  Location: [facility];  Service: Orthopedics;  Laterality: Right;   COLONOSCOPY WITH PROPOFOL  N/A 11/07/2020   Procedure: COLONOSCOPY WITH PROPOFOL ;  Surgeon: Unk Corinn Skiff, MD;  Location: [facility];  Service: Gastroenterology;  Laterality: N/A;   ESOPHAGOGASTRODUODENOSCOPY (EGD) WITH PROPOFOL  N/A 11/07/2020   Procedure: ESOPHAGOGASTRODUODENOSCOPY (EGD) WITH PROPOFOL ;  Surgeon: Unk Corinn Skiff, MD;  Location: [facility] ENDOSCOPY;  Service: Gastroenterology;  Laterality: N/A;   MASS EXCISION Left 03/31/2014   Procedure: EXCISION CYST ;  Surgeon: Arley JONELLE Curia, MD;  Location: [facility];  Service: Orthopedics;  Laterality: Left;  ANESTHESIA:  IV REGIONAL FAB   MYOMECTOMY     TRIGGER FINGER RELEASE Left 03/31/2014   Procedure: RELEASE A-1 PULLEY LEFT RING FINGER;  Surgeon: Arley JONELLE Curia, MD;  Location: [facility];  Service: Orthopedics;  Laterality: Left;    OB History   No obstetric history on file.      Home Medications    Prior to Admission medications  Medication Sig Start Date End Date Taking? Authorizing Provider  amoxicillin  (AMOXIL ) 875 MG tablet Take 1 tablet (875 mg total) by mouth 2 (two) times daily for 7 days. 10/19/24 10/26/24 Yes Corlis Burnard DEL, NP  aspirin EC 81 MG tablet Take 81 mg by mouth daily. 12/05/15   [provider]  calcium  carbonate (OS-CAL) 600 MG TABS Take 600  mg by mouth 2 (two) times daily with a meal.    [provider]  cetirizine (ZYRTEC) 10 MG tablet 1 tablet    [provider]  diazepam (VALIUM) 5 MG tablet Take 5 mg by mouth. 06/09/23   [provider]  folic acid  (FOLVITE ) 1 MG tablet Take 1 mg by mouth daily.    [provider]  ibuprofen (ADVIL) 400 MG tablet 1 tablet with food or milk as needed Orally Three times a day As needed    [provider]  metFORMIN (GLUCOPHAGE) 500 MG tablet 1 tablet with a meal Orally Twice a day 08/19/23   [provider]  Multiple  Vitamin (MULTI-VITAMINS) TABS Take 1 tablet by mouth daily.    [provider]  Multiple Vitamins-Minerals (MULTI FOR HER 50+) TABS 1 Tablet Orally Once a day    [provider]  Allie Gutta Calcium  500 MG TABS 1 tablet.    [provider]  rosuvastatin (CRESTOR) 5 MG tablet 1 tablet Orally Once a day 10/07/23   [provider]    Family History Family History  Problem Relation Age of Onset   Asthma Other    Hypertension Other    Transient ischemic attack Mother    Hypertension Mother    Asthma Mother    Dementia Father     Social History Social History[1]   Allergies   Other and Shellfish protein-containing drug products   Review of Systems Review of Systems  Constitutional:  Negative for chills and fever.  HENT:  Positive for congestion. Negative for ear pain and sore throat.   Respiratory:  Positive for cough. Negative for shortness of breath.   Cardiovascular:  Negative for chest pain and palpitations.     Physical Exam Triage Vital Signs ED Triage Vitals  Encounter Vitals Group     BP      Girls Systolic BP Percentile      Girls Diastolic BP Percentile      Boys Systolic BP Percentile      Boys Diastolic BP Percentile      Pulse      Resp      Temp      Temp src      SpO2      Weight      Height      Head Circumference      Peak Flow      Pain Score      Pain Loc      Pain Education      Exclude from Growth Chart    No data found.  Updated Vital Signs BP 119/74   Pulse 72   Temp 97.9 F (36.6 C)   Resp 18   LMP 02/02/2012   SpO2 98%   Visual Acuity Right Eye Distance:   Left Eye Distance:   Bilateral Distance:    Right Eye Near:   Left Eye Near:    Bilateral Near:     Physical Exam Constitutional:      General: She is not in acute distress. HENT:     Right Ear: Tympanic membrane normal.     Left Ear: Tympanic membrane normal.     Nose: Rhinorrhea present.     Mouth/Throat:     Mouth: Mucous  membranes are moist.     Pharynx: Oropharynx is clear.  Cardiovascular:     Rate and Rhythm: Normal rate and regular rhythm.     Heart sounds: Normal heart  sounds.  Pulmonary:     Effort: Pulmonary effort is normal. No respiratory distress.     Breath sounds: Normal breath sounds.  Neurological:     Mental Status: She is alert.      UC Treatments / Results  Labs (all labs ordered are listed, but only abnormal results are displayed) Labs Reviewed - No data to display  EKG   Radiology No results found.  Procedures Procedures (including critical care time)  Medications Ordered in UC Medications - No data to display  Initial Impression / Assessment and Plan / UC Course  I have reviewed the triage vital signs and the nursing notes.  Pertinent labs & imaging results that were available during my care of the patient were reviewed by me and considered in my medical decision making (see chart for details).    Acute sinusitis.  Afebrile and vital signs are stable.  Lungs are clear and O2 sat is 98% on room air.  Patient has been symptomatic for more than 2 weeks.  Treating today with amoxicillin .  Tylenol  or ibuprofen as needed, plain Mucinex  as needed.  Instructed her to follow-up with her PCP if she is not improving.  Education provided on sinus infection.  She agrees to plan of care.  Final Clinical Impressions(s) / UC Diagnoses   Final diagnoses:  Acute non-recurrent maxillary sinusitis     Discharge Instructions      Take the amoxicillin  as directed.  Follow-up with your primary care provider if your symptoms are not improving.      ED Prescriptions     Medication Sig Dispense Auth. Provider   amoxicillin  (AMOXIL ) 875 MG tablet Take 1 tablet (875 mg total) by mouth 2 (two) times daily for 7 days. 14 tablet Corlis Burnard DEL, NP      PDMP not reviewed this encounter.    [1]  Social History Tobacco Use   Smoking status: Never   Smokeless tobacco: Never   Substance Use Topics   Alcohol use: No   Drug use: No     Corlis Burnard DEL, NP 10/19/24 1420  "

## 2024-10-19 NOTE — ED Triage Notes (Addendum)
 Patient to Urgent Care with complaints of  productive cough x2.5 weeks. Some SHOB.  Taking mucinex .

## 2024-10-19 NOTE — Discharge Instructions (Addendum)
 Take the amoxicillin as directed.  Follow up with your primary care provider if your symptoms are not improving.
